# Patient Record
Sex: Female | Born: 1990 | Hispanic: No | Marital: Single | Smoking: Former smoker
Health system: Southern US, Community
[De-identification: ages and names within clinical notes are randomized; demographics above are authoritative.]

## PROBLEM LIST (undated history)

## (undated) ENCOUNTER — Ambulatory Visit: Admission: EM | Payer: Medicaid Other

## (undated) DIAGNOSIS — F419 Anxiety disorder, unspecified: Secondary | ICD-10-CM

## (undated) DIAGNOSIS — F32A Depression, unspecified: Secondary | ICD-10-CM

## (undated) DIAGNOSIS — S62308D Unspecified fracture of other metacarpal bone, subsequent encounter for fracture with routine healing: Secondary | ICD-10-CM

## (undated) HISTORY — DX: Depression, unspecified: F32.A

## (undated) HISTORY — DX: Anxiety disorder, unspecified: F41.9

## (undated) HISTORY — PX: CHOLECYSTECTOMY: SHX55

---

## 2013-09-12 ENCOUNTER — Inpatient Hospital Stay: Admit: 2013-09-12 | Discharge: 2013-09-12 | Disposition: A | Discharge status: Home / Self Care

## 2013-10-01 ENCOUNTER — Inpatient Hospital Stay
Admit: 2013-10-01 | Discharge: 2013-10-01 | Disposition: A | Discharge status: Home / Self Care | Attending: Emergency Medicine

## 2013-10-01 NOTE — ED Provider Notes (Signed)
PATIENTDEZARIA, METHOT        DOS:           10/01/2013  MR #:             [id number]             ACCOUNT #:     [id number]  DATE OF BIRTH:    11-24-1990              AGE:           23      PROBLEM LIST:     L Hand Pain: Entered Date: 01-Oct-2013 18:19, Entered By:  Carin Hock,  INTERFACES    HISTORY OF PRESENT ILLNESS:    PERTINENT HISTORY OF PRESENT  ILLNESS. The patient was evaluated in  collaboration with Dr. Benson Norway.  Chief complaint is left hand  pain  after punching a wall today.    PERTINENT PAST/ FAMILY/SOCIAL HISTORY Denies      PHYSICAL EXAM She is alert and oriented with stable vital signs.  She has  swelling at the fourth and fifth metacarpals of the left hand.  MSPs  intact.  There is no rotational deformity.  No wrist pain with range of  motion.    MEDICAL DECISION MAKING:    SIGNIFICANT FINDINGS/ED COURSE/MEDICAL DECISION MAKING/TREATMENT  PLAN Left  hand x-ray: Positive for a nondisplaced fifth metacarpal fracture.  And Ortho-Glass ulnar gutter splint placed.  She is prescribed #10 Norco for pain.  She is referred to orthopedic  clinic.  She voiced understanding of her instructions and all questions were  answered.      DIAGNOSIS Left fifth metacarpal fracture nondisplaced      ADDITIONAL INFORMATION If the physician assistant/nurse practitioner  was  involved in patient care, I personally performed and participated in  all the  above services (including HPI and PE). I have reviewed with the  physician  assistant/nurse practitioner the history and confirmed the findings  with the  patient. I personally performed all surgical procedures in the medical  record  unless otherwise indicated.    COPIES SENT TO::     NO, PCP DOCTOR(PCP): [id number]    Electronic Signatures:  Benson Norway (MD)  (Signed 01-Oct-2013 19:39)   Co-Signer: PROBLEM LIST, HISTORY OF PRESENT ILLNESS, PHYSICAL EXAM,  MEDICAL  DECISION MAKING, DIAGNOSIS, Additional Infomation, Copies to be sent  to:  Johnnette Litter (CNP)  (Signed 01-Oct-2013 19:36)   Authored: PROBLEM LIST, HISTORY OF PRESENT ILLNESS, PHYSICAL EXAM,  MEDICAL  DECISION MAKING, DIAGNOSIS, Additional Infomation, Copies to be sent  to:      Last Updated: 01-Oct-2013 19:39 by Benson Norway (MD)            Please see T-Sheet, initial assessment, and physician orders for  further details.    Dictating Physician: Johnnette Litter, CNP  Original Electronic Signature Date: 10/01/2013 06:29 P  [facility]  Document #: [id number]    cc:  PCP No       So[place]

## 2013-10-11 ENCOUNTER — Encounter

## 2013-10-14 ENCOUNTER — Encounter

## 2013-10-14 NOTE — Progress Notes (Signed)
Ms. ADRIANNA DUDAS returns today in follow-up of her recent left Small Finger Metacarpal  Fracture which occurred approximately 3 week ago. Options for treatment of her fracture, such as closed reduction or surgical stabilization were discussed & considered during prior visits and were Not indicated.  She has noted decreased discomfort and decreased swelling.    She notes no symptoms of numbness, tingling, no symptoms related to perfusion.    Physical Exam:  Skin (splint removed) and is Normal.   Digital range of motion is not assessed due to the presence of the un-healed fracture.  Wrist range of motion is somewhat stiff from immobilization.  Sensation is normal.  Vascular examination reveals normal and good capillary refill.  Swelling is minimal.  Clinical alignment and rotation are normal.      Radiographic Evaluation:  Radiographs were obtained today (3 views of the left hand) in the splint.  They demonstrate excellent maintenance of alignment of the fracture fragments, no rotational deformity, &  interval healing of the fracture.  The fracture does appear to be healed at this time.    Impression:  Ms. ARGENTINA KOSCH is doing well after recent left Small Finger Metacarpal Fracture.  It would appear that she has fully healed her fracture.    Plan:  Ms. Asuzena A Scaturro's splint is removed.    She is also instructed in  work on Active & Passive range of motion of the exposed digits & elbow.  These modalities were demonstrated to her today.  Her left 5th and 4th fingers were buddy taped together. We discussed the continued restrictions on the use of the injured hand & wrist and the limitations on resumption of activities.    We discussed the option of pursuing formalized hand therapy and a prescription  was not indicated.    I have asked Ms. GIRL SCHISSLER to follow-up with me in approximately 4 weeks from now for re-evaluation and repeat radiographs.    She is also specifically instructed to return to the office  or call for an appointment sooner if her symptoms are changing or worsening prior to that time.

## 2013-10-14 NOTE — Progress Notes (Deleted)
Subjective:      Patient ID: [name] is a 23 y.o. {race/ethnicity:17218} female.    Chief Complaint   Patient presents with   ??? Hand Injury     left 5th mc fx     {WUJ:81191}  {Also Blocks:18393::" "}     Social History     Occupational History   ??? Not on file.     Social History Main Topics   ??? Smoking status: Former Smoker   ??? Smokeless tobacco: Not on file   ??? Alcohol Use: Yes      Comment: occasionally   ??? Drug Use: No   ??? Sexual Activity: Not on file      ROS    Objective:   Ortho Exam      Assessment:     No diagnosis found.  ***    Plan:     ***

## 2013-12-21 ENCOUNTER — Encounter: Attending: Orthopaedic Surgery | Primary: Student in an Organized Health Care Education/Training Program

## 2013-12-30 LAB — C.TRACHOMATIS N.GONORRHOEAE DNA
C. Trachomatis Amplified: NEGATIVE
N. Gonorrhoeae Amplified: NEGATIVE

## 2014-03-16 ENCOUNTER — Inpatient Hospital Stay
Admit: 2014-03-16 | Discharge: 2014-03-17 | Disposition: A | Discharge status: Home / Self Care | Attending: Emergency Medicine

## 2014-03-16 NOTE — ED Provider Notes (Signed)
[name], [name]        DOS:           03/16/2014  MR #:             [id number]             ACCOUNT #:     [id number]  DATE OF BIRTH:    [birth year]              AGE:           23      HISTORY OF PRESENT ILLNESS:    PERTINENT HISTORY OF PRESENT  ILLNESS. Patient seen and evaluated  with  Lisbeth Ply. She presents the emergency department for complaint of nausea  for the  last 1 point 5 months. She states she has occasional abdominal pain.  She has no  pain currently. She denies vomiting but states she has a decreased  appetite. No  fevers or chills. She states she is having a problem with her  significant other  and feels anxious. No vaginal discharge or concern for that she  transmitted  infection.    PERTINENT PAST/ FAMILY/SOCIAL HISTORY Cholecystectomy        PHYSICAL EXAM Patient is alert and oriented x3 with normal vitals.  Head is  atraumatic. Pupils equal round reactive to light. Heart rate and  rhythm  regular. Lungs clear throughout. No respiratory distress. Abdomen is  soft and  nontender. Nondistended. Back is nontender. Extremities nontender  without  edema. Skin is normal in color with no rashes. Remainder of exam is  unremarkable.    MEDICAL DECISION MAKING:    SIGNIFICANT FINDINGS/ED COURSE/MEDICAL DECISION MAKING/TREATMENT  PLAN Patient  is well appearing in no acute distress. She has normal vital signs.  She is  afebrile. She complains of nausea for 1 point 5 months. She also  states she  feels anxious because she is having difficulties with her significant  other.  She states she has occasional mild abdominal discomfort. She is  currently  pain-free. Her abdomen is soft and nontender. No peritoneal signs.  Urinalysis  is negative for infection. HCG is negative. She she was Zofran ODT  here in the  emergency department. She'll be given a prescription for Phenergan.  She'll  followup with [facility] and return any worsening symptoms. She is  discharged in stable  condition.    PROBLEM LIST:       Admit Reason:     nausea - abd pain: Entered Date: 16-Mar-2014 17:52, Entered By:  Standley Brooking, Status: Active        DIAGNOSIS 1. nausea        ADDITIONAL INFORMATION If the physician assistant/nurse practitioner  was  involved in patient care, I personally performed and participated in  all the  above services (including HPI and PE). I have reviewed with the  physician  assistant/nurse practitioner the history and confirmed the findings  with the  patient. I personally performed all surgical procedures in the medical  record  unless otherwise indicated.      COPIES SENT TO::     NO, PCP DOCTOR(PCP): [id number]    Electronic Signatures:  Joellyn Haff (NP)  (Signed 16-Mar-2014 20:38)   Authored: HISTORY OF PRESENT ILLNESS, PHYSICAL EXAM, MEDICAL DECISION  MAKING,  PROBLEM LIST, DIAGNOSIS, Additional Infomation, Copies to be sent to:  Benson Norway (MD)  (Signed 16-Mar-2014 22:20)   Co-Signer: HISTORY  OF PRESENT ILLNESS, PHYSICAL EXAM, PROBLEM LIST,  Additional  Infomation, Copies to be sent to:      Last Updated: 16-Mar-2014 22:20 by Benson NorwayEJULIUS, DENNIS (MD)            Please see T-Sheet, initial assessment, and physician orders for  further details.    Dictating Physician: Joellyn HaffAmanda M Angelyn Osterberg, CNP  Original Electronic Signature Date: 03/16/2014 07:35 P  [facility]  Document #: [id number]    cc:  PCP No       Soarian

## 2014-03-17 LAB — URINALYSIS, MICRO: RBC, UA: NEGATIVE /HPF (ref 0–2)

## 2014-03-17 LAB — URINALYSIS-MACROSCOPIC
Bilirubin, Urine: 1
Nitrite, Urine: NEGATIVE
Occult Blood,Urine: NEGATIVE Ery/micL
Specific Gravity, Urine: 1.015 (ref 1.005–1.030)
Total Protein, Urine: NEGATIVE mg/dL
Volume: 12
pH, Urine: 7 (ref 5.0–8.0)

## 2014-03-17 LAB — PREGNANCY, URINE: HCG Urine: NEGATIVE

## 2014-03-30 ENCOUNTER — Encounter: Attending: Women's Health | Primary: Student in an Organized Health Care Education/Training Program

## 2014-04-26 NOTE — ED Provider Notes (Signed)
[name]:          [name], [name]        DOS:           04/26/2014  MR #:             [id number]             ACCOUNT #:     [id number]  DATE OF BIRTH:    Nov 05, 1990              AGE:           24      HISTORY OF PRESENT ILLNESS:    PERTINENT HISTORY OF PRESENT  ILLNESS. Patient was seen with Dr.  Gerrit HeckBlanda.  Patient presents complaining of burning abdominal pain.  She states  that the  last month she has had intermittent episodes of nausea and left upper  quadrant  pain.  These symptoms are worse when laying supine and accompanied by  Spence cough  at night and when she wakes in the morning.  Patient also notes that  they are  exacerbated by sutures fruits and "[employer]."  Patient states that  today this  burning pain became worse which prompted her emergency department  visit.  She  denies any fever, chills, chest pain, dyspnea, diaphoresis, or other  associated  symptoms.    PERTINENT PAST/ FAMILY/SOCIAL HISTORY Cholecystectomy        PHYSICAL EXAM Vitals: Patient is afebrile.  Vital signs  unremarkable.  General: Patient alert, sitting upright in bed. Non-toxic appearing,  in no  acute distress.  HEENT: Normocephalic, atraumatic. CN 2-12 grossly intact.  No scleral  icterus  noted.  Chest: Lungs clear to auscultation bilaterally.  No rales or wheezes  noted.  Heart regular rate and rhythm, no murmurs rubs or gallops.  Abdomen: Soft, mild epigastric tenderness, nondistended.  Bowel sounds  normal.  No guarding or rebound tenderness.  Extremities: No lower extremity tenderness or edema.  2+ peripheral  pulses in  all 4 extremities  Skin: Color normal, warm, dry    MEDICAL DECISION MAKING:    SIGNIFICANT FINDINGS/ED COURSE/MEDICAL DECISION MAKING/TREATMENT  PLAN Patient  presents complaining of burning epigastric pain exacerbated by citrus  foods,  worse when laying supine, and associated with Spence supine cough.  Physical exam  was remarkable for mild epigastric tenderness but was otherwise  unremarkable.  Given the  nature of the patient's symptoms and her physical exam, I am  not  concerned for Spence surgical abdomen at this time.  Patient has implanted  hormonal  birth control and uses condoms in declined pregnancy test while in  the  Emergency Department.  I believe her symptoms are consistent with  gastroesophageal reflux disease.  She was given Spence Programmer, applicationsGrasshopper which  improved  her symptoms.  She will be discharged with Spence prescription for Pepcid.  At this  point I feel she is safe to discharge home.  Diagnosis was discussed  with  patient.  She is amenable to discharge home with followup with her  PCP.  The  patient was instructed to return to the emergency department if her  symptoms  worsen or if she develops new problems.    PROBLEM LIST:       Admit Reason:     ABD Pain: Entered Date: 26-Apr-2014 21:29, Entered By: Standley BrookingINTERFACES,  INTERFACES, Status: Active        DIAGNOSIS 1.  Gastroesophageal reflux disease  ADDITIONAL INFORMATION The Emergency Medicine attending physician  was present  in the Emergency Department, who reviewed case management, and  approved  evaluation/treatment.      COPIES SENT TO::     NO, PCP DOCTOR(PCP): [id number]    Electronic Signatures:  Tommy Medal (MD)  (Signed 26-Apr-2014 23:43)   Co-Signer: HISTORY OF PRESENT ILLNESS, PHYSICAL EXAM, MEDICAL  DECISION MAKING,  PROBLEM LIST, DIAGNOSIS, Additional Infomation  Nakeya Adinolfi C (MD)  (Signed 26-Apr-2014 23:39)   Authored: HISTORY OF PRESENT ILLNESS, PHYSICAL EXAM, MEDICAL DECISION  MAKING,  PROBLEM LIST, DIAGNOSIS, Additional Infomation, Copies to be sent to:      Last Updated: 26-Apr-2014 23:43 by Tommy Medal (MD)            Please see T-Sheet, initial assessment, and physician orders for  further details.    Dictating Physician: Jacqulyn Cane, MD  Original Electronic Signature Date: 04/26/2014 11:06 P  [facility]  Document #: [id number]    cc:  PCP No       Soarian

## 2014-04-27 ENCOUNTER — Inpatient Hospital Stay
Admit: 2014-04-27 | Discharge: 2014-04-27 | Disposition: A | Discharge status: Home / Self Care | Attending: Emergency Medicine

## 2014-05-15 ENCOUNTER — Ambulatory Visit
Admit: 2014-05-15 | Discharge: 2014-05-15 | Payer: BLUE CROSS/BLUE SHIELD | Attending: Women's Health | Primary: Student in an Organized Health Care Education/Training Program

## 2014-05-15 DIAGNOSIS — R300 Dysuria: Secondary | ICD-10-CM

## 2014-05-15 LAB — POCT WET PREP
Clue Cells, Wet Prep: NEGATIVE
Hyphal Yeast: NEGATIVE
Trichomonas: NEGATIVE

## 2014-05-15 LAB — CBC
Hematocrit: 41 % (ref 35.0–47.0)
Hemoglobin: 13.4 g/dl (ref 11.7–16.0)
MCH: 26.7 pg (ref 26.0–34.0)
MCHC: 32.8 % (ref 32.0–36.0)
MCV: 81.6 fl (ref 79.0–98.0)
MPV: 8 fl (ref 7.4–10.4)
Platelets: 452 10*3/uL — ABNORMAL HIGH (ref 140–440)
RBC: 5.02 10*6/uL (ref 3.80–5.20)
RDW: 14.6 % — ABNORMAL HIGH (ref 11.5–14.5)
WBC: 7.9 10*3/uL (ref 3.6–10.7)

## 2014-05-15 LAB — TSH: TSH: 0.96 micUnt/mL (ref 0.36–3.74)

## 2014-05-15 MED ORDER — SULFAMETHOXAZOLE-TRIMETHOPRIM 800-160 MG PO TABS
800-160 MG | ORAL_TABLET | Freq: Two times a day (BID) | ORAL | Status: AC
Start: 2014-05-15 — End: 2014-05-18

## 2014-05-15 MED ORDER — PHENAZOPYRIDINE HCL 200 MG PO TABS
200 MG | ORAL_TABLET | Freq: Three times a day (TID) | ORAL | Status: AC | PRN
Start: 2014-05-15 — End: 2014-05-17

## 2014-05-15 NOTE — Progress Notes (Signed)
Subjective:      Patient ID: [name] is a 24 y.o. female.    HPI  Pt with c/o burning with urination and urinary frequency x5 days.  Denies pelvic pain.  Reports occasional discharge, but denies any currently.  Denies itching and odor.  Nexplanon for contraception.  Pt reports unexplained weight loss since last visit.  Pt has lost 20lbs since last visit without changing any of her daily habits.  Review of Systems   Constitutional: Positive for unexpected weight change.   Genitourinary: Positive for dysuria and difficulty urinating. Negative for vaginal bleeding, vaginal discharge, vaginal pain, menstrual problem and pelvic pain.       Objective:   Physical Exam   Constitutional: She is oriented to person, place, and time. She appears well-developed and well-nourished.   Pulmonary/Chest: Effort normal.   Genitourinary: No labial fusion. There is no rash, tenderness, lesion or injury on the right labia. There is no rash, tenderness, lesion or injury on the left labia. Cervix exhibits no discharge and no friability. No erythema, tenderness or bleeding in the vagina. No foreign body around the vagina. No signs of injury around the vagina. Vaginal discharge found.   Neurological: She is alert and oriented to person, place, and time.   Skin: Skin is warm and dry.   Psychiatric: She has a normal mood and affect.       Assessment:      1.) Dysuria  2.) Vaginal discharge  3.) Weight loss       Plan:      1.) Rx sulfa/tmp.  Urine culture sent.  2.) Wet prep negative.  GC/CT sent.  3.) Check TSH and CBC.  Instructed pt to f/u with PCP regarding weight loss.

## 2014-05-15 NOTE — Patient Instructions (Signed)
Painful Urination (Dysuria): Care Instructions  Your Care Instructions  Burning pain with urination (dysuria) is a common symptom of a urinary tract infection or other urinary problems. The bladder may become inflamed. This can cause pain when the bladder fills and empties. You may also feel pain if the tube that carries urine from the bladder to the outside of the body (urethra) gets irritated or infected.  Sexually transmitted infections (STIs) also may cause pain when you urinate.  Sometimes the pain can be caused by things other than an infection. The urethra can be irritated by soaps, perfumes, or foreign objects in the urethra. Kidney stones can cause pain when they pass through the urethra.  The cause may be hard to find. You may need tests. Treatment for painful urination depends on the cause.  Follow-up care is a key part of your treatment and safety. Be sure to make and go to all appointments, and call your doctor if you are having problems. It's also a good idea to know your test results and keep a list of the medicines you take.  How can you care for yourself at home?   Drink extra water and juices such as cranberry and blueberry juices for the next day or two. This will help make the urine less concentrated. And it may help wash out any bacteria that may be causing an infection. (If you have kidney, heart, or liver disease and have to limit fluids, talk with your doctor before you increase the amount of fluids you drink.)   Avoid drinks that are carbonated or have caffeine. They can irritate the bladder.   Urinate often. Try to empty your bladder each time.  For women:   Urinate right after you have sex.   After going to the bathroom, wipe from front to back.   Avoid douches, bubble baths, and feminine hygiene sprays. And avoid other feminine hygiene products that have deodorants.  When should you call for help?  Call your doctor now or seek immediate medical care if:   You have new symptoms,  such as fever, nausea, or vomiting.   You have new or worse symptoms of a urinary problem. For example:   You have blood or pus in your urine.   You have chills or body aches.   It hurts worse to urinate.   You have groin or belly pain.   You have pain in your back just below your rib cage (the flank area).  Watch closely for changes in your health, and be sure to contact your doctor if you have any problems.   Where can you learn more?   Go to [email or web address] and sign in to your MyChart account. Enter H814 in the Search Health Information box to learn more about "Painful Urination (Dysuria): Care Instructions."    If you do not have an account, please click on the "Sign Up Now" link.      2006-2015 [employer]. Care instructions adapted under license by [facility]. This care instruction is for use with your licensed healthcare professional. If you have questions about a medical condition or this instruction, always ask your healthcare professional. [employer] disclaims any warranty or liability for your use of this information.  Content Version: 10.6.465758; Current as of: November 02, 2012

## 2014-05-16 LAB — C.TRACHOMATIS N.GONORRHOEAE DNA
C. Trachomatis Amplified: POSITIVE — ABNORMAL HIGH
N. Gonorrhoeae Amplified: NEGATIVE

## 2014-05-16 MED ORDER — AZITHROMYCIN 500 MG PO TABS
500 MG | ORAL_TABLET | Freq: Once | ORAL | Status: AC
Start: 2014-05-16 — End: 2014-05-16

## 2014-05-16 NOTE — Telephone Encounter (Signed)
CAll to  Pt LM to call whc

## 2014-05-16 NOTE — Telephone Encounter (Signed)
Please let pt know she is +CT.  Rx azithromycin sent to pharmacy.  Pt will need to notify partner and should refrain from intercourse x7 days after finishing treatment.  Would like to see pt back in 3 months for repeat GC/CT testing.  Thanks.

## 2014-05-22 NOTE — Telephone Encounter (Signed)
Pt called and informed of the above and that a RX was sent to her pharmacy. Pt instructed to notify her partner and that he will also need treated and that they will need to abstain for at least 7 days after they both have finished tx. [facility] verbal[facility] understanding [facility]. Will pick Rx Up tonight

## 2014-06-19 ENCOUNTER — Ambulatory Visit
Admit: 2014-06-19 | Payer: BLUE CROSS/BLUE SHIELD | Attending: Hospitalist | Primary: Student in an Organized Health Care Education/Training Program

## 2014-06-19 DIAGNOSIS — R42 Dizziness and giddiness: Secondary | ICD-10-CM

## 2014-06-19 NOTE — Progress Notes (Signed)
INTERNAL MEDICINE  CLINIC     [name]  [birth year]  Visit Date: 06/19/14    Note completed by Dr. Tobey BrideSusanna Shirlean Berman (Internal Medicine Resident - PGY3).     Cosigned/Staffed with Dr. Ronnie DerbyStephanie Spence.    Reason for Appointment:       She  has no past medical history on file.    She presents today to be seen for dizziness and bilateral LE bruising.    Assessments & Plan      1. Dizziness  CBC    TSH without Reflex    Comprehensive Metabolic Panel  The patient's symptoms sound more like pre-syncope as opposed to vertigo. Some possible etiologies could be hypoglycemia, low BP (normal today in clinic), dehydration. Will check CBC and CMP today, but the work-up is somewhat limited because of the lack of time today. Will have her RTC in 1-2 weeks to continue work-up. Advised patient to call the clinic or go to the ED if symptoms worsen   2. Bruising  Will get CBC and CMP today to check for any abnormalities. Could be caused by a couple differentials, including platelet disorders, blood dyscrasias, liver disease, and others. Pt denies any bleeding. Will continue work-up at next appointment in 1-2 weeks       Health Maintenance Due   Topic Date Due   ??? TETANUS VACCINE ADULT (11 YEARS AND UP)  11/04/2002   ??? HIV SCREENING  11/04/2006       No problem-specific assessment & plan notes found for this encounter.      Medications, indications, directions, and side effects were discussed.    She voices understanding and acceptance of this advice and will call back if any further questions or concerns.    Follow-up:   No Follow-up on file.    Subjective:   HPI  HPI Comments: Of note, the patient indicated she only had 15 minutes before she had to leave from the start of the encounter. She indicates that starting on 14th, the patient has had episodes where she felt like she was going to faint. The first day it happened, she felt like she had a lot of pressure in her head and also reported feeling confused. 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 It lasted about 15 minutes and it has occurred every day since then, lasting the same amount of time, except the last 2 days. She can't associate it with any activity and nothing she does makes it better, it just passes on its own. She specifically denies any shortness of breath, diaphoresis or N/V with it. She is able to get up and move around without losing her balance and reports that it doesn't feel like the world is spinning. She has not started any new medications, has not had any recent illness and denies any dietary changes. Additionally, she reports that she has been  Noticing bruising on her B/L LE without trauma. She hasn't had any abnorma bleeding at all, specifically denying bleeding from her gums when brushing her teeth or nose bleeds. She reports that her menstrual cycle is very irregular, but has always been that way     Dizziness  This is a new problem. The current episode started 1 to 4 weeks ago (Started on 06/08/14). The problem occurs daily. The problem has been unchanged. Associated symptoms include arthralgias and myalgias. Pertinent negatives include no abdominal pain, anorexia, chest pain, chills, congestion, coughing, diaphoresis, fatigue,  fever, headaches, nausea, neck pain, numbness, urinary symptoms, vertigo, visual change, vomiting or weakness. Associated symptoms comments: C/o feeling a "pressure", as if her head is being squeezed. Denies psyncope, gait changes or diaphoresis. Reports feeling confused during episodes. Nothing aggravates the symptoms. She has tried nothing for the symptoms.       Review of Systems   Constitutional: Negative for fever, chills, diaphoresis and fatigue.   HENT: Negative for congestion.    Eyes: Negative for blurred vision.   Respiratory: Negative for cough.    Cardiovascular: Negative for chest pain.   Gastrointestinal: Negative for nausea, vomiting, abdominal pain and anorexia.    Musculoskeletal: Positive for myalgias and arthralgias. Negative for neck pain.   Neurological: Positive for dizziness. Negative for vertigo, weakness, numbness and headaches.   Endo/Heme/Allergies: Bruises/bleeds easily.       No Known Allergies  Current Outpatient Prescriptions on File Prior to Visit   Medication Sig Dispense Refill   ??? Acetaminophen (TYLENOL PO) Take by mouth every 6 hours     ??? ibuprofen (ADVIL;MOTRIN) 200 MG tablet Take 200 mg by mouth every 6 hours as needed for Pain       No current facility-administered medications on file prior to visit.        Objective:     Filed Vitals:    06/19/14 1556   BP: 105/62   Pulse: 76   Temp: 97.8 ??F (36.6 ??C)   Height:  (1.6 m)   Weight: 138 lb (62.596 kg)     Body mass index is 24.45 kg/(m^2).    Physical Exam   Constitutional: She is oriented to person, place, and time and well-developed, well-nourished, and in no distress. No distress.   HENT:   Head: Normocephalic and atraumatic.   Mouth/Throat: Oropharynx is clear and moist. No oropharyngeal exudate.   Eyes: Conjunctivae and EOM are normal. Pupils are equal, round, and reactive to light. Right eye exhibits no discharge. Left eye exhibits no discharge. No scleral icterus.   Neck: Normal range of motion. Neck supple. No tracheal deviation present. No thyromegaly present.   Cardiovascular: Normal rate, regular rhythm and normal heart sounds.  Exam reveals no gallop and no friction rub.    No murmur heard.  Pulmonary/Chest: Effort normal and breath sounds normal. No stridor. No respiratory distress. She has no wheezes. She has no rales. She exhibits no tenderness.   Musculoskeletal: Normal range of motion. She exhibits tenderness. She exhibits no edema.   Lymphadenopathy:     She has no cervical adenopathy.   Neurological: She is alert and oriented to person, place, and time. No cranial nerve deficit. Gait normal. Coordination normal.   Normal cerebellar function   Skin: She is not diaphoretic.    Ecchymosis present on B/L LE at different stages of healing       Electronically signed by Tobey Bride, MD on 06/19/14 at 4:26 PM

## 2014-06-20 DIAGNOSIS — R42 Dizziness and giddiness: Secondary | ICD-10-CM

## 2014-06-21 NOTE — Progress Notes (Signed)
Patient was staffed with me, plan of care was reviewed and note co-signed.

## 2014-08-22 ENCOUNTER — Encounter: Attending: Women's Health | Primary: Student in an Organized Health Care Education/Training Program

## 2014-08-25 ENCOUNTER — Encounter: Attending: Hospitalist | Primary: Student in an Organized Health Care Education/Training Program

## 2014-09-30 LAB — POCT GLUCOSE: POC Glucose: 72 mg/dL (ref 70–100)

## 2014-09-30 NOTE — ED Provider Notes (Signed)
[name], [name]        DOS:           09/30/2014  MR #:             [id number]             ACCOUNT #:     [id number]  DATE OF BIRTH:    11/12/1990              AGE:           24      HISTORY OF PRESENT ILLNESS:    PERTINENT HISTORY OF PRESENT  ILLNESS. 1 day of intermittent  headache.  Also  occasional feeling lightheaded with standing.  Had some diffuse  intermittent  tingling and photophobia.  Usually has anxiety with palpitations and  heart  racing.  She is has no history of migraine but has had headaches many  times  previously without a workup similar to this.    PERTINENT PAST/ FAMILY/SOCIAL HISTORY Cholecystectomy, chronic  headaches        PHYSICAL EXAM Vitals stable.  Patient is alert no acute distress.  Cranial  nerves II through XII intact.  Normal strength and sensation.  Normal  cerebellar exam.  Lightheaded with standing so was not ambulated.    MEDICAL DECISION MAKING:    SIGNIFICANT FINDINGS/ED COURSE/MEDICAL DECISION MAKING/TREATMENT  PLAN  Orthostatic negative.  After Compazine and Benadryl her symptoms had  resolved.  Able to stand without symptoms.  She is comfortable with being  discharged.    PROBLEM LIST:         ED Diagnosis:     Migraine with aura and without status migrainosus, not intractable  (G43.109): Entered Date: 30-Sep-2014 15:40, Entered By: Margo Common,  Status: Active, ICD-10: G43.109      COPIES SENT TO::     NO, PCP DOCTOR(PCP): [id number]    Electronic Signatures:  Margo Common (MD)  (Signed 30-Sep-2014 15:40)   Authored: HISTORY OF PRESENT ILLNESS, PHYSICAL EXAM, MEDICAL DECISION  MAKING,  PROBLEM LIST, Copies to be sent to:      Last Updated: 30-Sep-2014 15:40 by Margo Common (MD)            Please see T-Sheet, initial assessment, and physician orders for  further details.    Dictating Physician: Margo Common, MD  Original Electronic Signature Date: 09/30/2014 02:54 P  Document #: [id number]    cc:  PCP No        Soarian

## 2014-11-27 ENCOUNTER — Encounter
Attending: Student in an Organized Health Care Education/Training Program | Primary: Student in an Organized Health Care Education/Training Program

## 2014-11-29 ENCOUNTER — Encounter
Attending: Student in an Organized Health Care Education/Training Program | Primary: Student in an Organized Health Care Education/Training Program

## 2015-01-08 LAB — BASIC METABOLIC PANEL
Anion Gap: 8 NA
BUN: 7 mg/dL (ref 7–25)
CO2: 28 mmol/L (ref 21–32)
Calcium: 8.7 mg/dL (ref 8.2–10.1)
Chloride: 106 mmol/L (ref 98–109)
Creatinine: 0.62 mg/dL (ref 0.55–1.40)
EGFR IF NonAfrican American: >60 mL/min (ref >60)
Glucose: 84 mg/dL (ref 70–100)
Potassium: 3.8 mmol/L (ref 3.5–5.1)
Sodium: 142 mmol/L (ref 135–145)
eGFR African American: >60 mL/min (ref >60)

## 2015-01-08 LAB — CBC
Hematocrit: 37.3 % (ref 35.0–47.0)
Hemoglobin: 12.2 g/dL (ref 11.7–16.0)
MCH: 27.2 pg (ref 26.0–34.0)
MCHC: 32.8 % (ref 32.0–36.0)
MCV: 82.9 fL (ref 79.0–98.0)
MPV: 7.8 fL (ref 7.4–10.4)
Platelets: 322 10*3/uL (ref 140–440)
RBC: 4.5 10*6/uL (ref 3.80–5.20)
RDW: 14.3 % (ref 11.5–14.5)
WBC: 10.5 10*3/uL (ref 3.6–10.7)

## 2015-01-08 LAB — C-REACTIVE PROTEIN: CRP: <2.9 mg/L (ref 0.0–2.9)

## 2015-01-08 LAB — SEDIMENTATION RATE: Sed Rate: 8 mm/h (ref 0–20)

## 2015-01-08 NOTE — ED Provider Notes (Signed)
PATIENTTIMEA, Spence        DOS:           01/08/2015  MR #:             [id number]             ACCOUNT #:     [id number]  DATE OF BIRTH:    01-Sep-1990              AGE:           24      HISTORY OF PRESENT ILLNESS:    PERTINENT HISTORY OF PRESENT  ILLNESS. [name] seen with Dr. Genene Churn.  [name] presents complaining of right arm pain that began  approximately 10  hours ago.  She was sitting in her car when she had sudden onset of  burning  pain that has gotten worse over the last several hours.  She says it  hurts when  she moves.  She states that several days ago she did have a bone  marrow that  came across her arm but was not having similar pain after that.  She  states she  then noticed to small bumps appear on the medial aspect of the arm.  They are  not warm to touch.  They are tender palpation.  There is been no  drainage.  She  denies fever chills.  Denies chest pain or shortness of breath.  Denies nausea  or vomiting.  He denies numbness or tingling.    PERTINENT PAST/ FAMILY/SOCIAL HISTORY Cholecystectomy        PHYSICAL EXAM Vitals: [name] is afebrile.  Vital signs  unremarkable.  General: [name] alert, NAD, WD/WN  HEENT: Normocephalic, atraumatic. EOMI, moist mucous membranes, neck  is supple  Cardiac: regular rate and rhythm, no murmurs  Respiratory: Lungs clear to auscultation bilaterally.  No rales or  wheezes  noted.  Abdomen: Soft, nontender, nondistended.  No masses noted. No guarding  or  rebound tenderness.  Extremities: Tender palpation over medial aspect of right elbow with  overlying  patchy macular rash and edema, no short arc range of motion but pain  with  passive and active range of motion of the elbow, limiting strength  exam,  sensation intact to light touch distally    MEDICAL DECISION MAKING:    SIGNIFICANT FINDINGS/ED COURSE/MEDICAL DECISION MAKING/TREATMENT  PLAN Given  the [name]'s significant pain as well as slight swelling, we did  obtain an  x-ray which showed  minimal soft tissue swelling but no air or bony or  joint  abnormality.  Complete blood count BMP ESR and CRP were obtained were  unremarkable.  There is no evidence of infectious etiology to this  pain.  There  is no bony abnormality or muscular skeletal abnormality.  It is  possible that  it is inflammation from the trauma several days ago.  She was given  morphine  and Zofran for pain and does have improvement with this.  She is  instructed to  take Naprosyn at home for pain.  She is given signs and symptoms for  which to  return.  [name] is discharged in stable condition.    PROBLEM LIST:       ED Diagnosis:     Pain and swelling of right elbow (M25.521): Entered Date:  08-Jan-2015 04:49,  Entered By: Lytle Butte, Status: Active, ICD-10: O71.219  ADDITIONAL INFORMATION The Emergency Medicine attending physician  was present  in the Emergency Department, who reviewed case management, and  approved  evaluation/treatment.      COPIES SENT TO::     NO, PCP DOCTOR(PCP): [id number]  Electronic Signatures:  AHMED, RAMI (DO)  (Signed 14-Jan-2015 17:59)   Co-Signer: HISTORY OF PRESENT ILLNESS, PHYSICAL EXAM, MEDICAL  DECISION MAKING,  PROBLEM LIST, Additional Infomation, Copies to be sent to:  Lytle Butte (MD)  (Signed [phone] 04:49)   Authored: HISTORY OF PRESENT ILLNESS, PHYSICAL EXAM, MEDICAL DECISION  MAKING,  PROBLEM LIST, Additional Infomation, Copies to be sent to:      Last Updated: 14-Jan-2015 17:59 by AHMED, RAMI (DO)            Please see T-Sheet, initial assessment, and physician orders for  further details.    Dictating Physician: Viann Fish, MD  Original Electronic Signature Date: 01/08/2015 04:49 A  JF  Document #: [id number]    cc:  PCP No       Soarian

## 2015-01-16 ENCOUNTER — Ambulatory Visit
Admit: 2015-01-16 | Discharge: 2015-01-16 | Payer: PRIVATE HEALTH INSURANCE | Attending: Hospitalist | Primary: Student in an Organized Health Care Education/Training Program

## 2015-01-16 DIAGNOSIS — M79631 Pain in right forearm: Secondary | ICD-10-CM

## 2015-01-16 MED ORDER — DOXYCYCLINE HYCLATE 100 MG PO TABS
100 MG | ORAL_TABLET | Freq: Two times a day (BID) | ORAL | 0 refills | Status: DC
Start: 2015-01-16 — End: 2015-01-16

## 2015-01-16 MED ORDER — DOXYCYCLINE HYCLATE 100 MG PO TABS
100 MG | ORAL_TABLET | Freq: Two times a day (BID) | ORAL | 0 refills | Status: AC
Start: 2015-01-16 — End: 2015-01-21

## 2015-01-16 NOTE — Progress Notes (Signed)
Attending Physician Statement  I have discussed the care of [name] including pertinent history and exam findings,  with the resident. I have seen and examined the patient and the key elements of all parts of the encounter have been performed by me.  I agree with the assessment, plan and orders as documented by the resident.  (GC Modifier)    There is discrete area of redness medial aspect of right arm.  NO streaking erythema, joint pains or involvement of elbow, NO eschar or puncture marks.  Area is tender but NO fluctuance.  No area of bruising on the forarm.  Clinically patient does NOT appear to be in any distress upon my exam nor appear toxic.Will treat with doxycycline and warm compress.      Patient with multiple complaints that was difficult to pin-point a pertinent review of symptoms.      She asked resident to provide her a letter to welfare that she cannot work because she is "stressed from loosing her previous job."  We cannot provide this letter for her.      Will obtain STD screening since she was positive for Trichomonas in 05/10/14.  She requested HIV testing.

## 2015-01-16 NOTE — Progress Notes (Signed)
Subjective:  [name] is a 24 y.o. female with chief complaint of Follow-Up from Hospital (e.r ); Joint Swelling (rt side ); Chest Pain (sharp when she breaths under left breast ); Irritable Bowel Syndrome; Other (letter for work that gives her extra bathroom breaks); and Dizziness    Labs and imaging reviewed with patient during this office visit from 01/08/15.    HPI:  HPI  Patient is coming in mainly to check on a right arm swelling. Patient explians that she was in the car on 01/08/15 and her arm started to hurt, which came and went. Then she explains that 3 bumps came up. She went to the ED got it looked at and X-rayed with no evidence of fracture and was discharged home with naproxen. She comes in today explianing that one bump remains that is still red and swelling has increased somewhat. About 8 days before she was plalying with her bow-arrow and hit her self on that arm, but no brusing or cuts were made.    She feels that she has increased stress. She was recently fired from her job on October 10th, 2016 and would like to touch base with Behavioral health.     She explains she has IBS type symptoms ever since she got her gallbladder removed in December 2013.    Patient has chest pain right under her left breast. It lasted longer than usual on Saturday. It comes and goes. She noticed the beginnings of this a couple of weeks ago.    Patient explains that she has had throat pain over the weekend and explains she had some nausea as well as night sweats over the weekend, but that these have subsided.    ROS:  ROS  No recent issues with nausea, vomiting, consitipation, fevers, chills.  Patient explains that she had a headache over the weekend, but nothing currently.  No current issues with dizziness.  Patient explains she has some diarrhrea with soft formed stools and goes about 3 times a day.          Current Outpatient Prescriptions on File Prior to Visit   Medication Sig Dispense Refill   ???  Acetaminophen (TYLENOL PO) Take by mouth every 6 hours     ??? ibuprofen (ADVIL;MOTRIN) 200 MG tablet Take 200 mg by mouth every 6 hours as needed for Pain       No current facility-administered medications on file prior to visit.        No past medical history on file.  Family History   Problem Relation Age of Onset   ??? Other Mother      Social History     Social History   ??? Marital status: Married     Spouse name: N/A   ??? Number of children: N/A   ??? Years of education: N/A     Occupational History   ??? Not on file.     Social History Main Topics   ??? Smoking status: Former Smoker   ??? Smokeless tobacco: Not on file   ??? Alcohol use Yes      Comment: occasionally, 1-2 shots/week   ??? Drug use: No   ??? Sexual activity: Not on file     Other Topics Concern   ??? Not on file     Social History Narrative     Past Surgical History   Procedure Laterality Date   ??? Cholecystectomy  2013     Objective:  Vitals:  01/16/15 0903 01/16/15 0905 01/16/15 0906 01/16/15 0907   BP: 117/74 114/74 116/74 117/66   Site: Left Arm Left Arm Left Arm Left Arm   Position: Sitting Supine Sitting Sitting   Cuff Size: Medium Adult Medium Adult Medium Adult Medium Adult   Pulse: 81 76 82 97   Temp: 97.2 ??F (36.2 ??C)      TempSrc: Oral      Weight: 121 lb (54.9 kg)      Height: 5' 3"  (1.6 m)        Physical Exam  General: NAD, Alert, Non-diaphoretic.  HEENT: NCAT, EOMI, PERRL, MMM, No phyrangeal erythema.  Cardiac: RRR, S1+S2 heard, No murmurs, No gallops, No erythema or edema under left breast. No pain on palpation under left breast.  Pulm: CTAB, No wheezes, No crackles, Patient O2 saturation at 99% in office.  Abd: Soft, Nontender, Nondistended +BS.  Ext: Radial and Dorsalis Pedis pulses intact. No edema lower extremities. Right forearm has small swelling proximal to the medial epicondyle. No drainage or streaking is observed. No puncture wound is observed and no follicular involvement was observed. Location of focal edema is erythematous and warm to  touch. No epitrochlear lymphadenopathy.        Assessment:  1. Pain and swelling of forearm, right    2. Intercostal pain    3. Dizziness    4. STD (female)        Plan:  1. Pain and swelling of forearm, right  - Small swelling located proximally from the medial epicondyle of the right elbow. Initially started as 3 bumps in the same location, but has become one bump. This one has not fully resolved with the medications she obtained from her ED visit. This may be secondary to a bug bite. Will prescribe an antibiotic for 5 days and will advise warm compresses.   - doxycycline hyclate (VIBRA-TABS) 100 MG tablet; Take 1 tablet by mouth 2 times daily for 5 days  Dispense: 10 tablet; Refill: 0    2. Intercostal pain  - Started under left breast about a week ago. Comes and goes with breathing deeply or coughing/laughing. Vitals are within normal limits. Will obtain chest x-ray to rule out any infections vs. PE. This symptom may be related to the recent increase in stress in her life. A referral to [facility] was put in. Patient also met with behavioral health services, who will touch base with her in the next days to weeks. At this time would not like to start any medication since the increased stress and anxiety seems to be situational, but would like to have her establish with behavioral health services.   - XR Chest Standard TWO VW;  - [facility] ([address])    3. Dizziness  - Blood pressure while supine, sitting, and standing are within normal limits. No issues with dizziness during today's visit. It was explained to her that sometimes this may occur with dehydration. She was advised to drink plenty of water. Patient has not passed out as a result of the dizziness.    4. STD (female)  Patient has previous history of C. Trachomatis on 05/16/14. Will screen for other possible STDs such as HIV and Hepatitis B and C.  - Hepatitis B Surface Antibody  - Hepatitis B Surface Antigen  -  Hepatitis C Antibody  - HIV Screen      Orders Placed This Encounter   Medications   ??? doxycycline hyclate (VIBRA-TABS) 100  MG tablet     Sig: Take 1 tablet by mouth 2 times daily for 10 days     Dispense:  10 tablet     Refill:  0       Patient Active Problem List   Diagnosis   ??? Closed fracture of 5th metacarpal   ??? Dizziness   ??? Bruising       Medication indications, directions, and side effects were discussed.     Return in about 3 months (around 04/18/2015).    Great than 51% of the encounter was spent in direct patient interaction, history taking, physical examination, counseling, and education.    Electronically signed by Varney Baas on 01/16/15 at 9:10 AM

## 2015-01-16 NOTE — Progress Notes (Signed)
[place] CONSULT NOTE:    Met with patient per provider request. Pt experiencing depression/anxiety related to life stressors. Pt has recently gone through a divorce and has 2 children; 1 with special needs. Pt was recently laid off from her job and is losing her car today. Pt and her children currently reside with pt's mother. Pt reports her mother is going through depression/anxiety related to finances/life stressors and she and her mother often argue. Pt denies any SI/HI but does have difficulties feeling motivated with increase in irritability. Pt also has severe anxiety w/ mild panic attacks that have been causing chest pains for pt.     Review of Systems   Psychiatric/Behavioral: Positive for depression. Negative for suicidal ideas. The patient is nervous/anxious and has insomnia.    Reports impairment in sleep. Pt does not sleep d/t stress/anxiety and racing thoughts. Reports changes in appetite. Appetite fluctuates.     Physical Exam   Psychiatric: Memory and judgment normal. Her mood appears anxious. She exhibits a depressed mood. She expresses no homicidal and no suicidal ideation. She expresses no suicidal plans and no homicidal plans.    Patient appears appropriately dressed.  Patient's speech pattern is: Within normal limits.  Patient appears attentive and tearful.  Eye contact: good.  Affect: Anxious and Depressed  Thought process: linear.    INTERVENTION:  [facility] actively listened and provided support. Educated pt on correlation of physical and mental health and importance of caring for both. Pt often internalizes her emotions/feelings and reports little support. Discussed importance of talking through emotions/feelings rather than internalize as this could increase sxs. Pt not interested in counseling as she feels she does not have time for it. Pt is interested in starting medication and discussed this with Dr. Laurence Slate who will suggest ICV. Provided pt with [employer] to offer assistance to pt  and her family's basic needs. [facility] number  provided to pt and encouraged to call with questions/concerns. [place] to f/u with pt at later date.

## 2015-01-16 NOTE — Telephone Encounter (Signed)
Attempted to contact pt to schedule ICV. [facility] not answer [facility]

## 2015-01-16 NOTE — Progress Notes (Signed)
Patient refuses Influenza vaccine. Benefits and risks were discussed and understood.

## 2015-01-17 LAB — HEPATITIS B SURFACE ANTIGEN: Hepatitis B Surface Ag: NOT DETECTED NA

## 2015-01-17 LAB — HEPATITIS C ANTIBODY: Hepatitis C Ab: NOT DETECTED NA

## 2015-01-17 LAB — HIV SCREEN: HIV 1+2 AB+HIV1P24 AG, EIA: NONREACTIVE NA

## 2015-01-17 LAB — HEPATITIS B SURFACE ANTIBODY: Hep B S Ab: DETECTED NA — AB

## 2015-01-30 NOTE — Telephone Encounter (Signed)
[facility]. Pt reports no changes have been made in her mood or current stressors. Pt continues to feel depressed/anxious and having difficulties feeling motivated. Pt has not obtained new employment and no longer has a car to get around. Pt and her children currently living with her mother who is also stressed about finances. Pt has not engaged in counseling either but does talk to her boyfriend who has been emotionally supportive and helping financially when he can. Discussed starting antidepressants and pt agreeable to this. Discussed integrated care visits and pt requesting appt. ICV scheduled with Angelique Blonderenise, NP, for Friday 12/9 at 3pm. Pt was encouraged to call with questions/concerns. [facility] to f/u with pt at ICV.

## 2015-02-02 ENCOUNTER — Encounter: Attending: Nurse Practitioner | Primary: Student in an Organized Health Care Education/Training Program

## 2015-04-24 ENCOUNTER — Encounter: Attending: Hospitalist | Primary: Student in an Organized Health Care Education/Training Program

## 2019-08-25 DIAGNOSIS — Z419 Encounter for procedure for purposes other than remedying health state, unspecified: Secondary | ICD-10-CM | POA: Diagnosis not present

## 2019-09-09 DIAGNOSIS — O4403 Placenta previa specified as without hemorrhage, third trimester: Secondary | ICD-10-CM | POA: Diagnosis not present

## 2019-09-14 DIAGNOSIS — O365930 Maternal care for other known or suspected poor fetal growth, third trimester, not applicable or unspecified: Secondary | ICD-10-CM | POA: Diagnosis not present

## 2019-09-14 DIAGNOSIS — O365990 Maternal care for other known or suspected poor fetal growth, unspecified trimester, not applicable or unspecified: Secondary | ICD-10-CM | POA: Diagnosis not present

## 2019-09-14 DIAGNOSIS — Z3A31 31 weeks gestation of pregnancy: Secondary | ICD-10-CM | POA: Diagnosis not present

## 2019-09-25 DIAGNOSIS — Z419 Encounter for procedure for purposes other than remedying health state, unspecified: Secondary | ICD-10-CM | POA: Diagnosis not present

## 2019-09-27 DIAGNOSIS — O365990 Maternal care for other known or suspected poor fetal growth, unspecified trimester, not applicable or unspecified: Secondary | ICD-10-CM | POA: Diagnosis not present

## 2019-09-27 DIAGNOSIS — Z3A33 33 weeks gestation of pregnancy: Secondary | ICD-10-CM | POA: Diagnosis not present

## 2019-10-05 DIAGNOSIS — Z3A34 34 weeks gestation of pregnancy: Secondary | ICD-10-CM | POA: Diagnosis not present

## 2019-10-05 DIAGNOSIS — Z23 Encounter for immunization: Secondary | ICD-10-CM | POA: Diagnosis not present

## 2019-10-05 DIAGNOSIS — N390 Urinary tract infection, site not specified: Secondary | ICD-10-CM | POA: Diagnosis not present

## 2019-10-05 DIAGNOSIS — A609 Anogenital herpesviral infection, unspecified: Secondary | ICD-10-CM | POA: Diagnosis not present

## 2019-10-05 DIAGNOSIS — O4402 Placenta previa specified as without hemorrhage, second trimester: Secondary | ICD-10-CM | POA: Diagnosis not present

## 2019-10-05 DIAGNOSIS — Z590 Homelessness: Secondary | ICD-10-CM | POA: Diagnosis not present

## 2019-10-05 DIAGNOSIS — B009 Herpesviral infection, unspecified: Secondary | ICD-10-CM | POA: Diagnosis not present

## 2019-10-17 DIAGNOSIS — N898 Other specified noninflammatory disorders of vagina: Secondary | ICD-10-CM | POA: Diagnosis not present

## 2019-10-17 DIAGNOSIS — O26893 Other specified pregnancy related conditions, third trimester: Secondary | ICD-10-CM | POA: Diagnosis not present

## 2019-10-17 DIAGNOSIS — Z590 Homelessness: Secondary | ICD-10-CM | POA: Diagnosis not present

## 2019-10-18 DIAGNOSIS — O365990 Maternal care for other known or suspected poor fetal growth, unspecified trimester, not applicable or unspecified: Secondary | ICD-10-CM | POA: Diagnosis not present

## 2019-10-18 DIAGNOSIS — Z3A36 36 weeks gestation of pregnancy: Secondary | ICD-10-CM | POA: Diagnosis not present

## 2019-10-26 DIAGNOSIS — Z419 Encounter for procedure for purposes other than remedying health state, unspecified: Secondary | ICD-10-CM | POA: Diagnosis not present

## 2019-10-28 DIAGNOSIS — Z3483 Encounter for supervision of other normal pregnancy, third trimester: Secondary | ICD-10-CM | POA: Diagnosis not present

## 2019-11-07 DIAGNOSIS — Z2821 Immunization not carried out because of patient refusal: Secondary | ICD-10-CM | POA: Diagnosis not present

## 2019-11-07 DIAGNOSIS — O9852 Other viral diseases complicating childbirth: Secondary | ICD-10-CM | POA: Diagnosis not present

## 2019-11-07 DIAGNOSIS — S8012XA Contusion of left lower leg, initial encounter: Secondary | ICD-10-CM | POA: Diagnosis not present

## 2019-11-07 DIAGNOSIS — S8002XA Contusion of left knee, initial encounter: Secondary | ICD-10-CM | POA: Diagnosis not present

## 2019-11-07 DIAGNOSIS — M25562 Pain in left knee: Secondary | ICD-10-CM | POA: Diagnosis not present

## 2019-11-07 DIAGNOSIS — F17211 Nicotine dependence, cigarettes, in remission: Secondary | ICD-10-CM | POA: Diagnosis not present

## 2019-11-07 DIAGNOSIS — Z79899 Other long term (current) drug therapy: Secondary | ICD-10-CM | POA: Diagnosis not present

## 2019-11-07 DIAGNOSIS — B009 Herpesviral infection, unspecified: Secondary | ICD-10-CM | POA: Diagnosis not present

## 2019-11-07 DIAGNOSIS — O99334 Smoking (tobacco) complicating childbirth: Secondary | ICD-10-CM | POA: Diagnosis not present

## 2019-11-07 DIAGNOSIS — Z3A39 39 weeks gestation of pregnancy: Secondary | ICD-10-CM | POA: Diagnosis not present

## 2019-11-09 DIAGNOSIS — O418X30 Other specified disorders of amniotic fluid and membranes, third trimester, not applicable or unspecified: Secondary | ICD-10-CM | POA: Diagnosis not present

## 2019-11-09 DIAGNOSIS — O411230 Chorioamnionitis, third trimester, not applicable or unspecified: Secondary | ICD-10-CM | POA: Diagnosis not present

## 2019-11-09 DIAGNOSIS — O43813 Placental infarction, third trimester: Secondary | ICD-10-CM | POA: Diagnosis not present

## 2019-11-09 DIAGNOSIS — Z3A00 Weeks of gestation of pregnancy not specified: Secondary | ICD-10-CM | POA: Diagnosis not present

## 2019-11-25 DIAGNOSIS — Z419 Encounter for procedure for purposes other than remedying health state, unspecified: Secondary | ICD-10-CM | POA: Diagnosis not present

## 2019-12-26 DIAGNOSIS — Z419 Encounter for procedure for purposes other than remedying health state, unspecified: Secondary | ICD-10-CM | POA: Diagnosis not present

## 2020-01-25 DIAGNOSIS — Z419 Encounter for procedure for purposes other than remedying health state, unspecified: Secondary | ICD-10-CM | POA: Diagnosis not present

## 2020-02-25 DIAGNOSIS — Z419 Encounter for procedure for purposes other than remedying health state, unspecified: Secondary | ICD-10-CM | POA: Diagnosis not present

## 2020-03-27 DIAGNOSIS — Z419 Encounter for procedure for purposes other than remedying health state, unspecified: Secondary | ICD-10-CM | POA: Diagnosis not present

## 2020-04-24 DIAGNOSIS — Z419 Encounter for procedure for purposes other than remedying health state, unspecified: Secondary | ICD-10-CM | POA: Diagnosis not present

## 2020-05-25 DIAGNOSIS — Z419 Encounter for procedure for purposes other than remedying health state, unspecified: Secondary | ICD-10-CM | POA: Diagnosis not present

## 2020-06-24 DIAGNOSIS — Z419 Encounter for procedure for purposes other than remedying health state, unspecified: Secondary | ICD-10-CM | POA: Diagnosis not present

## 2020-07-23 ENCOUNTER — Emergency Department (HOSPITAL_COMMUNITY)
Admission: EM | Admit: 2020-07-23 | Discharge: 2020-07-23 | Disposition: A | Discharge status: Home / Self Care | Payer: Medicaid Other | Attending: Emergency Medicine | Admitting: Emergency Medicine

## 2020-07-23 ENCOUNTER — Encounter (HOSPITAL_COMMUNITY): Payer: Self-pay | Admitting: Emergency Medicine

## 2020-07-23 ENCOUNTER — Emergency Department (HOSPITAL_COMMUNITY): Payer: Medicaid Other

## 2020-07-23 DIAGNOSIS — M79605 Pain in left leg: Secondary | ICD-10-CM | POA: Diagnosis not present

## 2020-07-23 DIAGNOSIS — M25562 Pain in left knee: Secondary | ICD-10-CM | POA: Diagnosis not present

## 2020-07-23 LAB — CBC WITH DIFFERENTIAL/PLATELET
Abs Immature Granulocytes: 0 10*3/uL (ref 0.00–0.07)
Basophils Absolute: 0 10*3/uL (ref 0.0–0.1)
Basophils Relative: 0 %
Eosinophils Absolute: 0 10*3/uL (ref 0.0–0.5)
Eosinophils Relative: 0 %
HCT: 42.6 % (ref 36.0–46.0)
Hemoglobin: 14 g/dL (ref 12.0–15.0)
Immature Granulocytes: 0 %
Lymphocytes Relative: 44 %
Lymphs Abs: 1.1 10*3/uL (ref 0.7–4.0)
MCH: 28.3 pg (ref 26.0–34.0)
MCHC: 32.9 g/dL (ref 30.0–36.0)
MCV: 86.2 fL (ref 80.0–100.0)
Monocytes Absolute: 0.5 10*3/uL (ref 0.1–1.0)
Monocytes Relative: 21 %
Neutro Abs: 0.9 10*3/uL — ABNORMAL LOW (ref 1.7–7.7)
Neutrophils Relative %: 35 %
Platelets: 195 10*3/uL (ref 150–400)
RBC: 4.94 MIL/uL (ref 3.87–5.11)
RDW: 14.1 % (ref 11.5–15.5)
WBC: 2.5 10*3/uL — ABNORMAL LOW (ref 4.0–10.5)
nRBC: 0 % (ref 0.0–0.2)

## 2020-07-23 LAB — BASIC METABOLIC PANEL
Anion gap: 8 (ref 5–15)
BUN: 9 mg/dL (ref 6–20)
CO2: 29 mmol/L (ref 22–32)
Calcium: 9.5 mg/dL (ref 8.9–10.3)
Chloride: 107 mmol/L (ref 98–111)
Creatinine, Ser: 0.63 mg/dL (ref 0.44–1.00)
GFR, Estimated: >60 mL/min (ref >60)
Glucose, Bld: 119 mg/dL — ABNORMAL HIGH (ref 70–99)
Potassium: 3.6 mmol/L (ref 3.5–5.1)
Sodium: 144 mmol/L (ref 135–145)

## 2020-07-23 LAB — D-DIMER, QUANTITATIVE: D-Dimer, Quant: 0.4 ug/mL-FEU (ref 0.00–0.50)

## 2020-07-23 NOTE — ED Provider Notes (Signed)
[facility]-EMERGENCY DEPT Provider Note   CSN: [id number] Arrival date & time: 07/23/20  1919     History Chief Complaint  Patient presents with  . Leg Pain    Amy Jefferson is a 30 y.o. female.  HPI   30 year old otherwise healthy female presents emergency department with left leg pain.  She states over the past day she has developed pain behind the left knee.  She feels as if the left leg is swollen but has not noted any discoloration.  No fever, no injury to the leg.  No history of DVT.  History reviewed. No pertinent past medical history.  There are no problems to display for this patient.   Past Surgical History:  Procedure Laterality Date  . CHOLECYSTECTOMY       OB History   No obstetric history on file.     No family history on file.     Home Medications Prior to Admission medications   Not on File    Allergies    Patient has no known allergies.  Review of Systems   Review of Systems  Constitutional: Negative for fever.  Respiratory: Negative for shortness of breath.   Cardiovascular: Negative for chest pain.  Gastrointestinal: Negative for abdominal pain.  Genitourinary: Negative for dysuria.  Musculoskeletal:       + Left lower extremity pain  Skin: Negative for color change.  Neurological: Negative for headaches.    Physical Exam Updated Vital Signs BP (!) 123/98   Pulse 77   Temp 98.3 F (36.8 C) (Oral)   Resp 16   Ht 5\' 3"  (1.6 m)   Wt 52.2 kg   LMP 07/23/2020   SpO2 100%   BMI 20.37 kg/m   Physical Exam Vitals and nursing note reviewed.  Constitutional:      Appearance: Normal appearance.  HENT:     Head: Normocephalic.     Mouth/Throat:     Mouth: Mucous membranes are moist.  Cardiovascular:     Rate and Rhythm: Normal rate.  Pulmonary:     Effort: Pulmonary effort is normal. No respiratory distress.  Abdominal:     Palpations: Abdomen is soft.  Musculoskeletal:     Comments: Left lower  extremity appears unremarkable, slight tenderness to palpation of the popliteal fossa but otherwise no other acute findings.  Skin:    General: Skin is warm.  Neurological:     Mental Status: She is alert and oriented to person, place, and time. Mental status is at baseline.  Psychiatric:        Mood and Affect: Mood normal.     ED Results / Procedures / Treatments   Labs (all labs ordered are listed, but only abnormal results are displayed) Labs Reviewed  CBC WITH DIFFERENTIAL/PLATELET - Abnormal; Notable for the following components:      Result Value   WBC 2.5 (*)    Neutro Abs 0.9 (*)    All other components within normal limits  BASIC METABOLIC PANEL - Abnormal; Notable for the following components:   Glucose, Bld 119 (*)    All other components within normal limits  D-DIMER, QUANTITATIVE  I-STAT BETA HCG BLOOD, ED ([facility], [facility], [facility] ONLY)    EKG None  Radiology DG Knee 2 Views Left  Result Date: 07/23/2020 CLINICAL DATA:  Left knee pain for 2 days EXAM: LEFT KNEE - 2 VIEW COMPARISON:  None FINDINGS: No evidence of fracture, dislocation, or joint effusion. No evidence of arthropathy or other  focal bone abnormality. Soft tissues are unremarkable. IMPRESSION: No acute abnormality noted. Electronically Signed   By: Alcide Clever M.D.   On: 07/23/2020 20:56    Procedures Procedures   Medications Ordered in ED Medications - No data to display  ED Course  I have reviewed the triage vital signs and the nursing notes.  Pertinent labs & imaging results that were available during my care of the patient were reviewed by me and considered in my medical decision making (see chart for details).    MDM Rules/Calculators/A&P                          30 year old female presents the emergency department with left lower extremity discomfort.  She has tenderness to palpation the popliteal fossa but otherwise the leg objectively is unremarkable.  Equal palpable DP pulses, she is neuro  intact.  X-ray shows no fracture, D-dimer is negative.  Blood work is otherwise normal.  Will refer outpatient to primary doctor and orthopedics as needed.  Patient will be discharged and treated as an outpatient.  Discharge plan and strict return to ED precautions discussed, patient verbalizes understanding and agreement.  Final Clinical Impression(s) / ED Diagnoses Final diagnoses:  Left leg pain    Rx / DC Orders ED Discharge Orders    None       Rozelle Logan, DO 07/23/20 2229

## 2020-07-23 NOTE — Discharge Instructions (Signed)
You have been seen and discharged from the emergency department.  Your x-rays were negative for fracture, your blood work was negative for DVT.  Follow-up with your primary provider and/or orthopedics for reevaluation and further care. Take home medications as prescribed. If you have any worsening symptoms or further concerns for your health please return to an emergency department for further evaluation.

## 2020-07-23 NOTE — ED Triage Notes (Signed)
Patient c/o pain to left leg with swelling to posterior knee since yesterday. Denies injury. Denies chest pain and SOB. Ambulatory.

## 2020-07-25 DIAGNOSIS — Z419 Encounter for procedure for purposes other than remedying health state, unspecified: Secondary | ICD-10-CM | POA: Diagnosis not present

## 2020-08-01 ENCOUNTER — Emergency Department (HOSPITAL_COMMUNITY)
Admission: EM | Admit: 2020-08-01 | Discharge: 2020-08-02 | Disposition: A | Discharge status: Home / Self Care | Payer: Medicaid Other | Attending: Emergency Medicine | Admitting: Emergency Medicine

## 2020-08-01 DIAGNOSIS — R0602 Shortness of breath: Secondary | ICD-10-CM | POA: Diagnosis present

## 2020-08-01 DIAGNOSIS — U071 COVID-19: Secondary | ICD-10-CM | POA: Insufficient documentation

## 2020-08-01 DIAGNOSIS — F419 Anxiety disorder, unspecified: Secondary | ICD-10-CM | POA: Diagnosis not present

## 2020-08-01 DIAGNOSIS — R079 Chest pain, unspecified: Secondary | ICD-10-CM | POA: Diagnosis not present

## 2020-08-01 DIAGNOSIS — R0600 Dyspnea, unspecified: Secondary | ICD-10-CM | POA: Diagnosis not present

## 2020-08-01 DIAGNOSIS — M549 Dorsalgia, unspecified: Secondary | ICD-10-CM | POA: Diagnosis not present

## 2020-08-02 ENCOUNTER — Emergency Department (HOSPITAL_COMMUNITY): Payer: Medicaid Other

## 2020-08-02 ENCOUNTER — Encounter (HOSPITAL_COMMUNITY): Payer: Self-pay | Admitting: Emergency Medicine

## 2020-08-02 DIAGNOSIS — R079 Chest pain, unspecified: Secondary | ICD-10-CM | POA: Diagnosis not present

## 2020-08-02 DIAGNOSIS — R0600 Dyspnea, unspecified: Secondary | ICD-10-CM | POA: Diagnosis not present

## 2020-08-02 LAB — BASIC METABOLIC PANEL
Anion gap: 10 (ref 5–15)
BUN: 12 mg/dL (ref 6–20)
CO2: 25 mmol/L (ref 22–32)
Calcium: 9.4 mg/dL (ref 8.9–10.3)
Chloride: 105 mmol/L (ref 98–111)
Creatinine, Ser: 0.73 mg/dL (ref 0.44–1.00)
GFR, Estimated: >60 mL/min (ref >60)
Glucose, Bld: 90 mg/dL (ref 70–99)
Potassium: 3.3 mmol/L — ABNORMAL LOW (ref 3.5–5.1)
Sodium: 140 mmol/L (ref 135–145)

## 2020-08-02 LAB — RAPID URINE DRUG SCREEN, HOSP PERFORMED
Amphetamines: NOT DETECTED
Barbiturates: NOT DETECTED
Benzodiazepines: NOT DETECTED
Cocaine: NOT DETECTED
Opiates: NOT DETECTED
Tetrahydrocannabinol: POSITIVE — AB

## 2020-08-02 LAB — URINALYSIS, ROUTINE W REFLEX MICROSCOPIC
Bilirubin Urine: NEGATIVE
Glucose, UA: NEGATIVE mg/dL
Hgb urine dipstick: NEGATIVE
Ketones, ur: 5 mg/dL — AB
Leukocytes,Ua: NEGATIVE
Nitrite: NEGATIVE
Protein, ur: NEGATIVE mg/dL
Specific Gravity, Urine: 1.029 (ref 1.005–1.030)
pH: 6 (ref 5.0–8.0)

## 2020-08-02 LAB — CBC WITH DIFFERENTIAL/PLATELET
Abs Immature Granulocytes: 0.01 10*3/uL (ref 0.00–0.07)
Basophils Absolute: 0 10*3/uL (ref 0.0–0.1)
Basophils Relative: 0 %
Eosinophils Absolute: 0 10*3/uL (ref 0.0–0.5)
Eosinophils Relative: 0 %
HCT: 36.4 % (ref 36.0–46.0)
Hemoglobin: 11.9 g/dL — ABNORMAL LOW (ref 12.0–15.0)
Immature Granulocytes: 0 %
Lymphocytes Relative: 29 %
Lymphs Abs: 2 10*3/uL (ref 0.7–4.0)
MCH: 28.6 pg (ref 26.0–34.0)
MCHC: 32.7 g/dL (ref 30.0–36.0)
MCV: 87.5 fL (ref 80.0–100.0)
Monocytes Absolute: 0.9 10*3/uL (ref 0.1–1.0)
Monocytes Relative: 13 %
Neutro Abs: 4 10*3/uL (ref 1.7–7.7)
Neutrophils Relative %: 58 %
Platelets: 414 10*3/uL — ABNORMAL HIGH (ref 150–400)
RBC: 4.16 MIL/uL (ref 3.87–5.11)
RDW: 13.7 % (ref 11.5–15.5)
WBC: 6.9 10*3/uL (ref 4.0–10.5)
nRBC: 0 % (ref 0.0–0.2)

## 2020-08-02 LAB — PREGNANCY, URINE: Preg Test, Ur: NEGATIVE

## 2020-08-02 LAB — RESP PANEL BY RT-PCR (FLU A&B, COVID) ARPGX2
Influenza A by PCR: NEGATIVE
Influenza B by PCR: NEGATIVE
SARS Coronavirus 2 by RT PCR: POSITIVE — AB

## 2020-08-02 LAB — CBG MONITORING, ED: Glucose-Capillary: 79 mg/dL (ref 70–99)

## 2020-08-02 LAB — ETHANOL: Alcohol, Ethyl (B): <10 mg/dL (ref <10)

## 2020-08-02 NOTE — ED Provider Notes (Signed)
[facility]-EMERGENCY DEPT Provider Note   CSN: [id number] Arrival date & time: 08/01/20  2322     History Chief Complaint  Patient presents with   Back Pain    Amy Jefferson is a 30 y.o. female.  Patient presents to the emergency department with a chief complaint of back pain and shortness of breath.  She states that this started earlier today.  She also reports feeling confused and anxious.  She denies any drug or alcohol use.  Denies fever, chills, cough.  Denies any treatment prior to arrival.  Denies any past medical problems.  The history is provided by the patient. No language interpreter was used. 6     No past medical history on file.  There are no problems to display for this patient.   Past Surgical History:  Procedure Laterality Date   CHOLECYSTECTOMY       OB History   No obstetric history on file.     No family history on file.     Home Medications Prior to Admission medications   Not on File    Allergies    Patient has no known allergies.  Review of Systems   Review of Systems  All other systems reviewed and are negative.  Physical Exam Updated Vital Signs BP (!) 136/113   Pulse 70   Temp 98.4 F (36.9 C) (Oral)   Resp 18   Ht 5\' 3"  (1.6 m)   Wt 52.2 kg   LMP 07/23/2020   SpO2 100%   BMI 20.37 kg/m   Physical Exam Vitals and nursing note reviewed.  Constitutional:      General: She is not in acute distress.    Appearance: She is well-developed.  HENT:     Head: Normocephalic and atraumatic.  Eyes:     Conjunctiva/sclera: Conjunctivae normal.  Cardiovascular:     Rate and Rhythm: Normal rate and regular rhythm.     Heart sounds: No murmur heard. Pulmonary:     Effort: Pulmonary effort is normal. No respiratory distress.     Breath sounds: Normal breath sounds.  Abdominal:     Palpations: Abdomen is soft.     Tenderness: There is no abdominal tenderness.  Musculoskeletal:        General: Normal range  of motion.     Cervical back: Neck supple.  Skin:    General: Skin is warm and dry.  Neurological:     Mental Status: She is alert and oriented to person, place, and time.  Psychiatric:        Mood and Affect: Mood normal.        Behavior: Behavior normal.    ED Results / Procedures / Treatments   Labs (all labs ordered are listed, but only abnormal results are displayed) Labs Reviewed  RESP PANEL BY RT-PCR (FLU A&B, COVID) ARPGX2  CBC WITH DIFFERENTIAL/PLATELET  BASIC METABOLIC PANEL  RAPID URINE DRUG SCREEN, HOSP PERFORMED  URINALYSIS, ROUTINE W REFLEX MICROSCOPIC  PREGNANCY, URINE  ETHANOL  CBG MONITORING, ED    EKG None  Radiology No results found.  Procedures Procedures   Medications Ordered in ED Medications - No data to display  ED Course  I have reviewed the triage vital signs and the nursing notes.  Pertinent labs & imaging results that were available during my care of the patient were reviewed by me and considered in my medical decision making (see chart for details).    MDM Rules/Calculators/A&P  Patient states that she has some back pain or shortness of breath.  Denies any fevers.  She states symptoms just started today.  Denies any known sick contacts.    Vital signs are stable.  She is afebrile here.  She is not hypoxic nor tachycardic.  Laboratory work-up is thus far reassuring, with the exception that she is COVID-positive.  I advised the patient of her positive COVID status.    Patient to quarantine at home.  She understands and agrees the plan.  She is stable ready for discharge.   Final Clinical Impression(s) / ED Diagnoses Final diagnoses:  COVID-19    Rx / DC Orders ED Discharge Orders     None        Roxy Horseman, PA-C 08/02/20 1610    Geoffery Lyons, MD 08/02/20 [phone]

## 2020-08-02 NOTE — ED Triage Notes (Signed)
Patient is complaining of mid back pain and sob. Patient states this started 08/02/2020.

## 2020-08-09 DIAGNOSIS — M25562 Pain in left knee: Secondary | ICD-10-CM | POA: Diagnosis not present

## 2020-08-24 DIAGNOSIS — Z419 Encounter for procedure for purposes other than remedying health state, unspecified: Secondary | ICD-10-CM | POA: Diagnosis not present

## 2020-09-12 DIAGNOSIS — M79604 Pain in right leg: Secondary | ICD-10-CM | POA: Diagnosis not present

## 2020-09-12 DIAGNOSIS — R102 Pelvic and perineal pain: Secondary | ICD-10-CM | POA: Diagnosis not present

## 2020-09-12 DIAGNOSIS — M25562 Pain in left knee: Secondary | ICD-10-CM | POA: Diagnosis not present

## 2020-09-12 DIAGNOSIS — M79659 Pain in unspecified thigh: Secondary | ICD-10-CM | POA: Diagnosis not present

## 2020-09-13 ENCOUNTER — Ambulatory Visit (HOSPITAL_COMMUNITY)
Admission: RE | Admit: 2020-09-13 | Discharge: 2020-09-13 | Disposition: A | Discharge status: Home / Self Care | Payer: Medicaid Other | Source: Ambulatory Visit | Attending: Cardiology | Admitting: Cardiology

## 2020-09-13 ENCOUNTER — Other Ambulatory Visit (HOSPITAL_COMMUNITY): Payer: Self-pay | Admitting: Orthopedic Surgery

## 2020-09-13 ENCOUNTER — Other Ambulatory Visit: Payer: Self-pay

## 2020-09-13 DIAGNOSIS — M79662 Pain in left lower leg: Secondary | ICD-10-CM | POA: Diagnosis not present

## 2020-09-13 DIAGNOSIS — M7989 Other specified soft tissue disorders: Secondary | ICD-10-CM

## 2020-09-24 DIAGNOSIS — Z419 Encounter for procedure for purposes other than remedying health state, unspecified: Secondary | ICD-10-CM | POA: Diagnosis not present

## 2020-10-25 DIAGNOSIS — Z419 Encounter for procedure for purposes other than remedying health state, unspecified: Secondary | ICD-10-CM | POA: Diagnosis not present

## 2020-11-09 ENCOUNTER — Ambulatory Visit (INDEPENDENT_AMBULATORY_CARE_PROVIDER_SITE_OTHER): Payer: Medicaid Other

## 2020-11-09 ENCOUNTER — Other Ambulatory Visit: Payer: Self-pay

## 2020-11-09 ENCOUNTER — Ambulatory Visit
Admission: EM | Admit: 2020-11-09 | Discharge: 2020-11-09 | Disposition: A | Discharge status: Home / Self Care | Payer: Medicaid Other | Attending: Urgent Care | Admitting: Urgent Care

## 2020-11-09 DIAGNOSIS — S92491A Other fracture of right great toe, initial encounter for closed fracture: Secondary | ICD-10-CM

## 2020-11-09 DIAGNOSIS — M7989 Other specified soft tissue disorders: Secondary | ICD-10-CM | POA: Diagnosis not present

## 2020-11-09 DIAGNOSIS — M79674 Pain in right toe(s): Secondary | ICD-10-CM

## 2020-11-09 DIAGNOSIS — S92424A Nondisplaced fracture of distal phalanx of right great toe, initial encounter for closed fracture: Secondary | ICD-10-CM | POA: Diagnosis not present

## 2020-11-09 MED ORDER — NAPROXEN 500 MG PO TABS: 500.0000 mg | ORAL_TABLET | Freq: Two times a day (BID) | ORAL | 0 refills | Status: DC
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
## 2020-11-20 ENCOUNTER — Ambulatory Visit: Payer: Medicaid Other | Admitting: Podiatry

## 2020-11-24 DIAGNOSIS — Z419 Encounter for procedure for purposes other than remedying health state, unspecified: Secondary | ICD-10-CM | POA: Diagnosis not present

## 2020-12-17 ENCOUNTER — Other Ambulatory Visit: Payer: Self-pay

## 2020-12-18 ENCOUNTER — Emergency Department (HOSPITAL_BASED_OUTPATIENT_CLINIC_OR_DEPARTMENT_OTHER)
Admission: EM | Admit: 2020-12-18 | Discharge: 2020-12-19 | Disposition: A | Discharge status: Home / Self Care | Payer: Medicaid Other | Attending: Emergency Medicine | Admitting: Emergency Medicine

## 2020-12-18 ENCOUNTER — Emergency Department (HOSPITAL_BASED_OUTPATIENT_CLINIC_OR_DEPARTMENT_OTHER): Payer: Medicaid Other

## 2020-12-18 ENCOUNTER — Encounter (HOSPITAL_BASED_OUTPATIENT_CLINIC_OR_DEPARTMENT_OTHER): Payer: Self-pay

## 2020-12-18 ENCOUNTER — Other Ambulatory Visit: Payer: Self-pay

## 2020-12-18 DIAGNOSIS — R109 Unspecified abdominal pain: Secondary | ICD-10-CM | POA: Insufficient documentation

## 2020-12-18 DIAGNOSIS — M25559 Pain in unspecified hip: Secondary | ICD-10-CM | POA: Diagnosis not present

## 2020-12-18 DIAGNOSIS — S6991XA Unspecified injury of right wrist, hand and finger(s), initial encounter: Secondary | ICD-10-CM | POA: Diagnosis not present

## 2020-12-18 DIAGNOSIS — M549 Dorsalgia, unspecified: Secondary | ICD-10-CM | POA: Diagnosis not present

## 2020-12-18 DIAGNOSIS — T07XXXA Unspecified multiple injuries, initial encounter: Secondary | ICD-10-CM

## 2020-12-18 DIAGNOSIS — Z0471 Encounter for examination and observation following alleged adult physical abuse: Secondary | ICD-10-CM | POA: Diagnosis not present

## 2020-12-18 DIAGNOSIS — M542 Cervicalgia: Secondary | ICD-10-CM | POA: Insufficient documentation

## 2020-12-18 DIAGNOSIS — S60211A Contusion of right wrist, initial encounter: Secondary | ICD-10-CM | POA: Diagnosis not present

## 2020-12-18 LAB — PREGNANCY, URINE: Preg Test, Ur: NEGATIVE

## 2020-12-18 MED ORDER — ACETAMINOPHEN 500 MG PO TABS
1000.0000 mg | ORAL_TABLET | Freq: Once | ORAL | Status: AC
  Administered 2020-12-18: 1000 mg via ORAL
  Filled 2020-12-18: qty 2
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
## 2020-12-19 DIAGNOSIS — S6991XA Unspecified injury of right wrist, hand and finger(s), initial encounter: Secondary | ICD-10-CM | POA: Diagnosis not present

## 2020-12-25 DIAGNOSIS — Z419 Encounter for procedure for purposes other than remedying health state, unspecified: Secondary | ICD-10-CM | POA: Diagnosis not present

## 2021-01-24 DIAGNOSIS — Z419 Encounter for procedure for purposes other than remedying health state, unspecified: Secondary | ICD-10-CM | POA: Diagnosis not present

## 2021-02-24 DIAGNOSIS — Z419 Encounter for procedure for purposes other than remedying health state, unspecified: Secondary | ICD-10-CM | POA: Diagnosis not present

## 2021-03-27 DIAGNOSIS — Z419 Encounter for procedure for purposes other than remedying health state, unspecified: Secondary | ICD-10-CM | POA: Diagnosis not present

## 2021-04-04 ENCOUNTER — Other Ambulatory Visit: Payer: Self-pay

## 2021-04-04 ENCOUNTER — Encounter: Payer: Self-pay | Admitting: Emergency Medicine

## 2021-04-04 ENCOUNTER — Ambulatory Visit
Admission: EM | Admit: 2021-04-04 | Discharge: 2021-04-04 | Disposition: A | Discharge status: Home / Self Care | Payer: Medicaid Other | Attending: Internal Medicine | Admitting: Internal Medicine

## 2021-04-04 DIAGNOSIS — R109 Unspecified abdominal pain: Secondary | ICD-10-CM

## 2021-04-04 DIAGNOSIS — B009 Herpesviral infection, unspecified: Secondary | ICD-10-CM

## 2021-04-04 LAB — POCT URINE PREGNANCY: Preg Test, Ur: NEGATIVE

## 2021-04-04 LAB — POCT URINALYSIS DIP (MANUAL ENTRY)
Bilirubin, UA: NEGATIVE
Blood, UA: NEGATIVE
Glucose, UA: NEGATIVE mg/dL
Ketones, POC UA: NEGATIVE mg/dL
Leukocytes, UA: NEGATIVE
Nitrite, UA: NEGATIVE
Protein Ur, POC: NEGATIVE mg/dL
Spec Grav, UA: 1.02 (ref 1.010–1.025)
Urobilinogen, UA: 0.2 E.U./dL
pH, UA: 6.5 (ref 5.0–8.0)

## 2021-04-04 MED ORDER — ACYCLOVIR 400 MG PO TABS: 800.0000 mg | ORAL_TABLET | Freq: Two times a day (BID) | ORAL | 0 refills | Status: AC

## 2021-04-04 NOTE — ED Provider Notes (Signed)
[facility]    CSN: [id number] Arrival date & time: 04/04/21  1435      History   Chief Complaint No chief complaint on file.   HPI Amy Jefferson is a 31 y.o. female.   Patient here today for evaluation of left-sided flank pain that started about 3 days ago.  She reports that pain radiates into her left upper abdomen.  She feels that symptoms have worsened since onset.  She states she feels very fatigued and lethargic.  She has not had fever.  She notes that urine has looked different since the onset of pain.  She has history of abdominal pain and reports she has had colonoscopy and other work-up.  She also reports that she needs a refill of her acyclovir as she has had a flare of herpes since symptoms started.  The history is provided by the patient.   History reviewed. No pertinent past medical history.  There are no problems to display for this patient.   Past Surgical History:  Procedure Laterality Date   CHOLECYSTECTOMY      OB History   No obstetric history on file.      Home Medications    Prior to Admission medications   Medication Sig Start Date End Date Taking? Authorizing Provider  acyclovir (ZOVIRAX) 400 MG tablet Take 2 tablets (800 mg total) by mouth 2 (two) times daily for 5 days. 04/04/21 04/09/21 Yes Tomi Bamberger, PA-C  naproxen (NAPROSYN) 500 MG tablet Take 1 tablet (500 mg total) by mouth 2 (two) times daily with a meal. 11/09/20   Wallis Bamberg, PA-C    Family History History reviewed. No pertinent family history.  Social History Social History   Tobacco Use   Smoking status: Never   Smokeless tobacco: Never  Vaping Use   Vaping Use: Never used  Substance Use Topics   Alcohol use: Yes    Comment: occ   Drug use: Yes    Types: Marijuana     Allergies   Lactose   Review of Systems Review of Systems  Constitutional:  Negative for chills and fever.  Gastrointestinal:  Positive for abdominal pain. Negative for nausea  and vomiting.  Genitourinary:  Positive for flank pain. Negative for dysuria and frequency.  Musculoskeletal:  Positive for back pain.    Physical Exam Triage Vital Signs ED Triage Vitals  Enc Vitals Group     BP 04/04/21 1558 126/66     Pulse Rate 04/04/21 1558 76     Resp 04/04/21 1558 16     Temp 04/04/21 1558 98 F (36.7 C)     Temp Source 04/04/21 1558 Oral     SpO2 04/04/21 1558 98 %     Weight --      Height --      Head Circumference --      Peak Flow --      Pain Score 04/04/21 1559 8     Pain Loc --      Pain Edu? --      Excl. in GC? --    No data found.  Updated Vital Signs BP 126/66 (BP Location: Left Arm)    Pulse 76    Temp 98 F (36.7 C) (Oral)    Resp 16    SpO2 98%      Physical Exam Vitals and nursing note reviewed.  Constitutional:      General: She is not in acute distress.    Appearance: Normal appearance.  She is not ill-appearing.  HENT:     Head: Normocephalic and atraumatic.     Nose: Nose normal.  Cardiovascular:     Rate and Rhythm: Normal rate and regular rhythm.     Heart sounds: Normal heart sounds. No murmur heard. Pulmonary:     Effort: Pulmonary effort is normal. No respiratory distress.     Breath sounds: Normal breath sounds. No wheezing, rhonchi or rales.  Abdominal:     General: Abdomen is flat. Bowel sounds are normal. There is no distension.     Palpations: Abdomen is soft.     Tenderness: There is abdominal tenderness (mild TTP to LUQ). There is no guarding.  Skin:    General: Skin is warm and dry.  Neurological:     Mental Status: She is alert.  Psychiatric:        Mood and Affect: Mood normal.        Thought Content: Thought content normal.     UC Treatments / Results  Labs (all labs ordered are listed, but only abnormal results are displayed) Labs Reviewed  CBC WITH DIFFERENTIAL/PLATELET  COMPREHENSIVE METABOLIC PANEL  H. PYLORI ANTIBODY, IGG  POCT URINALYSIS DIP (MANUAL ENTRY)  POCT URINE PREGNANCY     EKG   Radiology No results found.  Procedures Procedures (including critical care time)  Medications Ordered in UC Medications - No data to display  Initial Impression / Assessment and Plan / UC Course  I have reviewed the triage vital signs and the nursing notes.  Pertinent labs & imaging results that were available during my care of the patient were reviewed by me and considered in my medical decision making (see chart for details).    Routine labs ordered. UA was normal. Upreg was negative. Recommended further evaluation in the ED if symptoms worsen in any way. Patient expresses understanding.  Acyclovir refilled as requested.   Final Clinical Impressions(s) / UC Diagnoses   Final diagnoses:  Flank pain  Herpes simplex   Discharge Instructions   None    ED Prescriptions     Medication Sig Dispense Auth. Provider   acyclovir (ZOVIRAX) 400 MG tablet Take 2 tablets (800 mg total) by mouth 2 (two) times daily for 5 days. 20 tablet Tomi Bamberger, PA-C      PDMP not reviewed this encounter.   Tomi Bamberger, PA-C 04/04/21 1827

## 2021-04-04 NOTE — ED Triage Notes (Signed)
Left sided flank pain radiating into left stomach x 3 days, gradually increasing. Woke up this morning and pain has become more severe to the point she's now fatigued and "doesn't feel right." Also states that due to the pain and stress, she's had a flare up of her genital herpes and is requesting the medication for that. States her urine has looked different since the start of the pain.

## 2021-04-05 LAB — H. PYLORI ANTIBODY, IGG: H. pylori, IgG AbS: 0.14 Index Value (ref 0.00–0.79)

## 2021-04-05 LAB — CBC WITH DIFFERENTIAL/PLATELET
Basophils Absolute: 0 10*3/uL (ref 0.0–0.2)
Basos: 0 %
EOS (ABSOLUTE): 0 10*3/uL (ref 0.0–0.4)
Eos: 0 %
Hematocrit: 42.5 % (ref 34.0–46.6)
Hemoglobin: 14.1 g/dL (ref 11.1–15.9)
Immature Grans (Abs): 0 10*3/uL (ref 0.0–0.1)
Immature Granulocytes: 0 %
Lymphocytes Absolute: 2 10*3/uL (ref 0.7–3.1)
Lymphs: 21 %
MCH: 28.7 pg (ref 26.6–33.0)
MCHC: 33.2 g/dL (ref 31.5–35.7)
MCV: 87 fL (ref 79–97)
Monocytes Absolute: 0.6 10*3/uL (ref 0.1–0.9)
Monocytes: 7 %
Neutrophils Absolute: 6.7 10*3/uL (ref 1.4–7.0)
Neutrophils: 72 %
Platelets: 349 10*3/uL (ref 150–450)
RBC: 4.91 x10E6/uL (ref 3.77–5.28)
RDW: 14 % (ref 11.7–15.4)
WBC: 9.3 10*3/uL (ref 3.4–10.8)

## 2021-04-05 LAB — COMPREHENSIVE METABOLIC PANEL
ALT: 12 IU/L (ref 0–32)
AST: 15 IU/L (ref 0–40)
Albumin/Globulin Ratio: 1.8 (ref 1.2–2.2)
Albumin: 4.9 g/dL (ref 3.9–5.0)
Alkaline Phosphatase: 72 IU/L (ref 44–121)
BUN/Creatinine Ratio: 21 (ref 9–23)
BUN: 15 mg/dL (ref 6–20)
Bilirubin Total: 0.2 mg/dL (ref 0.0–1.2)
CO2: 22 mmol/L (ref 20–29)
Calcium: 9.9 mg/dL (ref 8.7–10.2)
Chloride: 100 mmol/L (ref 96–106)
Creatinine, Ser: 0.73 mg/dL (ref 0.57–1.00)
Globulin, Total: 2.8 g/dL (ref 1.5–4.5)
Glucose: 79 mg/dL (ref 70–99)
Potassium: 4.1 mmol/L (ref 3.5–5.2)
Sodium: 136 mmol/L (ref 134–144)
Total Protein: 7.7 g/dL (ref 6.0–8.5)
eGFR: 114 mL/min/{1.73_m2} (ref >59)

## 2021-04-06 ENCOUNTER — Telehealth: Payer: Self-pay | Admitting: Emergency Medicine

## 2021-04-24 ENCOUNTER — Other Ambulatory Visit (HOSPITAL_BASED_OUTPATIENT_CLINIC_OR_DEPARTMENT_OTHER): Payer: Self-pay

## 2021-04-24 ENCOUNTER — Emergency Department (HOSPITAL_BASED_OUTPATIENT_CLINIC_OR_DEPARTMENT_OTHER)
Admission: EM | Admit: 2021-04-24 | Discharge: 2021-04-24 | Disposition: A | Discharge status: Home / Self Care | Payer: Medicaid Other | Attending: Emergency Medicine | Admitting: Emergency Medicine

## 2021-04-24 ENCOUNTER — Other Ambulatory Visit: Payer: Self-pay

## 2021-04-24 ENCOUNTER — Emergency Department (HOSPITAL_BASED_OUTPATIENT_CLINIC_OR_DEPARTMENT_OTHER): Payer: Medicaid Other | Admitting: Radiology

## 2021-04-24 ENCOUNTER — Encounter (HOSPITAL_BASED_OUTPATIENT_CLINIC_OR_DEPARTMENT_OTHER): Payer: Self-pay | Admitting: Emergency Medicine

## 2021-04-24 DIAGNOSIS — R509 Fever, unspecified: Secondary | ICD-10-CM | POA: Diagnosis not present

## 2021-04-24 DIAGNOSIS — J069 Acute upper respiratory infection, unspecified: Secondary | ICD-10-CM | POA: Diagnosis not present

## 2021-04-24 DIAGNOSIS — Z20822 Contact with and (suspected) exposure to covid-19: Secondary | ICD-10-CM | POA: Diagnosis not present

## 2021-04-24 DIAGNOSIS — R079 Chest pain, unspecified: Secondary | ICD-10-CM | POA: Insufficient documentation

## 2021-04-24 DIAGNOSIS — R059 Cough, unspecified: Secondary | ICD-10-CM | POA: Diagnosis not present

## 2021-04-24 DIAGNOSIS — B349 Viral infection, unspecified: Secondary | ICD-10-CM | POA: Diagnosis not present

## 2021-04-24 DIAGNOSIS — Z419 Encounter for procedure for purposes other than remedying health state, unspecified: Secondary | ICD-10-CM | POA: Diagnosis not present

## 2021-04-24 DIAGNOSIS — N9489 Other specified conditions associated with female genital organs and menstrual cycle: Secondary | ICD-10-CM | POA: Insufficient documentation

## 2021-04-24 LAB — RESP PANEL BY RT-PCR (FLU A&B, COVID) ARPGX2
Influenza A by PCR: NEGATIVE
Influenza B by PCR: NEGATIVE
SARS Coronavirus 2 by RT PCR: NEGATIVE

## 2021-04-24 LAB — CBC
HCT: 35.1 % — ABNORMAL LOW (ref 36.0–46.0)
Hemoglobin: 11.7 g/dL — ABNORMAL LOW (ref 12.0–15.0)
MCH: 28.6 pg (ref 26.0–34.0)
MCHC: 33.3 g/dL (ref 30.0–36.0)
MCV: 85.8 fL (ref 80.0–100.0)
Platelets: 280 10*3/uL (ref 150–400)
RBC: 4.09 MIL/uL (ref 3.87–5.11)
RDW: 13.8 % (ref 11.5–15.5)
WBC: 10.2 10*3/uL (ref 4.0–10.5)
nRBC: 0 % (ref 0.0–0.2)

## 2021-04-24 LAB — HCG, QUANTITATIVE, PREGNANCY: hCG, Beta Chain, Quant, S: <1 m[IU]/mL (ref <5)

## 2021-04-24 LAB — BASIC METABOLIC PANEL
Anion gap: 9 (ref 5–15)
BUN: 8 mg/dL (ref 6–20)
CO2: 27 mmol/L (ref 22–32)
Calcium: 9.3 mg/dL (ref 8.9–10.3)
Chloride: 103 mmol/L (ref 98–111)
Creatinine, Ser: 0.57 mg/dL (ref 0.44–1.00)
GFR, Estimated: >60 mL/min (ref >60)
Glucose, Bld: 117 mg/dL — ABNORMAL HIGH (ref 70–99)
Potassium: 4.1 mmol/L (ref 3.5–5.1)
Sodium: 139 mmol/L (ref 135–145)

## 2021-04-24 LAB — PREGNANCY, URINE: Preg Test, Ur: NEGATIVE

## 2021-04-24 LAB — TROPONIN I (HIGH SENSITIVITY): Troponin I (High Sensitivity): <2 ng/L (ref <18)

## 2021-04-24 MED ORDER — BENZONATATE 100 MG PO CAPS
100.0000 mg | ORAL_CAPSULE | Freq: Three times a day (TID) | ORAL | 0 refills | Status: DC
  Filled 2021-04-24: qty 21, 7d supply, fill #0

## 2021-04-24 NOTE — ED Triage Notes (Signed)
Pt states sore throat, coughing up bloody mucus, chest tingling/burning, SOB and brain foggy x2 days ?

## 2021-04-24 NOTE — ED Provider Notes (Signed)
[facility] EMERGENCY DEPT Provider Note   CSN: [id number] Arrival date & time: 04/24/21  1123     History  Chief Complaint  Patient presents with   Fever   Cough    Amy Jefferson is a 31 y.o. female.  Patient with no pertinent past medical history presents today with chief complaint of chest pain.  She states that same has been ongoing for some time now but has been worsening in the past 6 weeks. She states that it is intermittent in nature, is located substernally and does not radiate. It is not reproducible to palpation. She states that it is triggered by caffeine and exercise. She denies any shortness of breath or leg pain. No recent travel, history of blood clots, history of malignancy, recent surgeries.  She is not on oral contraceptives. No hx of cardiac problems, she has not been evaluated for these symptoms in the past. Patient states that she does not currently smoke, she stopped a year ago, denies alcohol use, recreational drug use, or IVDU.  Additionally, patient presents with 5 days of URI symptoms including cough, congestion, and rhinorrhea.  She presents today with her 3 children who are sick with similar symptoms.  She has been taking Tylenol for pain and fevers.  She denies any shortness of breath.  The history is provided by the patient. No language interpreter was used.  Fever Associated symptoms: chest pain, congestion and cough   Associated symptoms: no chills, no confusion, no diarrhea, no ear pain, no headaches, no nausea, no rash, no rhinorrhea, no sore throat and no vomiting   Cough Associated symptoms: chest pain and fever   Associated symptoms: no chills, no ear pain, no headaches, no rash, no rhinorrhea, no shortness of breath, no sore throat and no wheezing       Home Medications Prior to Admission medications   Medication Sig Start Date End Date Taking? Authorizing Provider  naproxen (NAPROSYN) 500 MG tablet Take 1 tablet (500 mg total) by  mouth 2 (two) times daily with a meal. 11/09/20   Wallis Bamberg, PA-C      Allergies    Lactose    Review of Systems   Review of Systems  Constitutional:  Positive for fever. Negative for activity change, appetite change and chills.  HENT:  Positive for congestion. Negative for ear pain, rhinorrhea, sore throat, tinnitus, trouble swallowing and voice change.   Respiratory:  Positive for cough. Negative for shortness of breath, wheezing and stridor.   Cardiovascular:  Positive for chest pain. Negative for leg swelling.  Gastrointestinal:  Negative for abdominal pain, diarrhea, nausea and vomiting.  Musculoskeletal:  Negative for back pain, neck pain and neck stiffness.  Skin:  Negative for rash.  Neurological:  Negative for dizziness, tremors, seizures, syncope, facial asymmetry, speech difficulty, weakness, light-headedness, numbness and headaches.  Psychiatric/Behavioral:  Negative for confusion.   All other systems reviewed and are negative.  Physical Exam Updated Vital Signs BP 119/76    Pulse 85    Temp 98.5 F (36.9 C) (Oral)    Resp 17    Ht 5\' 3"  (1.6 m)    Wt 52.2 kg    LMP 04/10/2021 (Approximate)    SpO2 99%    BMI 20.37 kg/m  Physical Exam Vitals and nursing note reviewed.  Constitutional:      General: She is not in acute distress.    Appearance: Normal appearance. She is normal weight. She is not ill-appearing, toxic-appearing or diaphoretic.  Comments: Patient resting comfortably in bed in no acute distress  HENT:     Head: Normocephalic and atraumatic.     Right Ear: Tympanic membrane, ear canal and external ear normal.     Left Ear: Tympanic membrane, ear canal and external ear normal.     Nose: Nose normal.     Mouth/Throat:     Mouth: Mucous membranes are moist.  Eyes:     Extraocular Movements: Extraocular movements intact.     Pupils: Pupils are equal, round, and reactive to light.  Cardiovascular:     Rate and Rhythm: Normal rate and regular rhythm.      Pulses: Normal pulses.     Heart sounds: Normal heart sounds.  Pulmonary:     Effort: Pulmonary effort is normal. No respiratory distress.     Breath sounds: Normal breath sounds. No wheezing or rales.  Abdominal:     General: Abdomen is flat.     Palpations: Abdomen is soft.     Tenderness: There is no abdominal tenderness.  Musculoskeletal:        General: No tenderness. Normal range of motion.     Cervical back: Normal range of motion and neck supple. No tenderness.     Right lower leg: No edema.     Left lower leg: No edema.  Skin:    General: Skin is warm and dry.  Neurological:     General: No focal deficit present.     Mental Status: She is alert.  Psychiatric:        Mood and Affect: Mood normal.        Behavior: Behavior normal.    ED Results / Procedures / Treatments   Labs (all labs ordered are listed, but only abnormal results are displayed) Labs Reviewed  BASIC METABOLIC PANEL - Abnormal; Notable for the following components:      Result Value   Glucose, Bld 117 (*)    All other components within normal limits  CBC - Abnormal; Notable for the following components:   Hemoglobin 11.7 (*)    HCT 35.1 (*)    All other components within normal limits  RESP PANEL BY RT-PCR (FLU A&B, COVID) ARPGX2  PREGNANCY, URINE  HCG, QUANTITATIVE, PREGNANCY  TROPONIN I (HIGH SENSITIVITY)  TROPONIN I (HIGH SENSITIVITY)    EKG None  Radiology DG Chest 2 View  Result Date: 04/24/2021 CLINICAL DATA:  Chest pain, fever and cough. EXAM: CHEST - 2 VIEW COMPARISON:  Radiographs 08/02/2020. FINDINGS: The heart size and mediastinal contours are normal. The lungs are clear. There is no pleural effusion or pneumothorax. No acute osseous findings are identified. IMPRESSION: Stable chest.  No active cardiopulmonary process. Electronically Signed   By: Carey Bullocks M.D.   On: 04/24/2021 13:05    Procedures Procedures    Medications Ordered in ED Medications - No data to  display  ED Course/ Medical Decision Making/ A&P                           Medical Decision Making Amount and/or Complexity of Data Reviewed Labs: ordered. Radiology: ordered.  Risk Prescription drug management.   This patient presents to the ED for concern of chest pain, this involves an extensive number of treatment options, and is a complaint that carries with it a high risk of complications and morbidity.   Co morbidities that complicate the patient evaluation  none   Lab Tests:  I Ordered,  and personally interpreted labs.  The pertinent results include:  COVID/flu negative. No abnormal laboratory findings   Imaging Studies ordered:  I ordered imaging studies including CXR  I independently visualized and interpreted imaging which showed  No active cardiopulmonary process I agree with the radiologist interpretation   Cardiac Monitoring:  The patient was maintained on a cardiac monitor.  I personally viewed and interpreted the cardiac monitored which showed an underlying rhythm of: NSR, no STEMI   Social Determinants of Health:  No pcp   Given the large differential diagnosis for Malcolm Agro, the decision making in this case is of high complexity.  After evaluating all of the data points in this case, the presentation of Paylin Heavin is NOT consistent with Acute Coronary Syndrome (ACS) and/or myocardial ischemia, pulmonary embolism, aortic dissection; Borhaave's, significant arrythmia, pneumothorax, cardiac tamponade, or other emergent cardiopulmonary condition.  Further, the presentation of Cloteal Dunnell is NOT consistent with pericarditis, myocarditis, cholecystitis, pancreatitis, mediastinitis, endocarditis, new valvular disease.  Additionally, the presentation of Tuwanna Bryantis NOT consistent with flail chest, cardiac contusion, ARDS, or significant intra-thoracic or intra-abdominal bleeding.  Moreover, this presentation is NOT consistent with pneumonia,  sepsis, or pyelonephritis.  The patient has a   heart score of 1, very low risk for cardiac events. She is currently pain free.  She is afebrile, nontoxic-appearing, and in no acute distress with reassuring vital signs, benign physical exam, and benign findings today.  Therefore feel that she is stable for discharge at this time with close PCP follow-up.  I have emphasized with the patient the importance of establishing care with a PCP and she is understanding.  I have also given her information about the wellness center where she can follow-up.  She is understanding and amenable with plan, educated on red flag symptoms of prompt immediate return.  Discharged in stable condition.   Data Reviewed/Counseling: I have reviewed the patient's vital signs, nursing notes, and other relevant tests/information. I had a detailed discussion regarding the historical points, exam findings, and any diagnostic results supporting the discharge diagnosis. I also discussed the need for outpatient follow-up and the need to return to the ED if symptoms worsen or if there are any questions or concerns that arise at home.   Final Clinical Impression(s) / ED Diagnoses Final diagnoses:  Chest pain, unspecified type  Viral URI with cough    Rx / DC Orders ED Discharge Orders     None     An After Visit Summary was printed and given to the patient.     Vear Clock 04/24/21 2031    Margarita Grizzle, MD 04/25/21 [phone]

## 2021-04-24 NOTE — Discharge Instructions (Addendum)
As we discussed, your work-up in the ER today was reassuring for acute abnormalities.  It is very important that you establish care with a primary care doctor who can evaluate you regularly.  Given you information for the wellness center where you can follow-up if you are unable to establish care with a primary doctor. ? ?I have also given you a prescription for cough suppressant medication for you to take as needed for your symptoms.  I also recommend Tylenol/ibuprofen for pain and fevers. ? ?Return if development of any new or worsening symptoms. ?

## 2021-05-25 DIAGNOSIS — Z419 Encounter for procedure for purposes other than remedying health state, unspecified: Secondary | ICD-10-CM | POA: Diagnosis not present

## 2021-06-17 ENCOUNTER — Encounter: Payer: Self-pay | Admitting: Family Medicine

## 2021-06-17 ENCOUNTER — Ambulatory Visit: Payer: Medicaid Other | Attending: Family Medicine | Admitting: Family Medicine

## 2021-06-17 VITALS — BP 102/68 | HR 87 | Ht 63.0 in | Wt 122.0 lb

## 2021-06-17 DIAGNOSIS — R1012 Left upper quadrant pain: Secondary | ICD-10-CM

## 2021-06-17 DIAGNOSIS — R109 Unspecified abdominal pain: Secondary | ICD-10-CM | POA: Diagnosis not present

## 2021-06-17 DIAGNOSIS — Z8249 Family history of ischemic heart disease and other diseases of the circulatory system: Secondary | ICD-10-CM | POA: Diagnosis not present

## 2021-06-17 DIAGNOSIS — R002 Palpitations: Secondary | ICD-10-CM | POA: Diagnosis not present

## 2021-06-17 NOTE — Progress Notes (Signed)
? ?Subjective:  ?Patient ID: Amy Jefferson, female    DOB: September 04, 1990  Age: 31 y.o. MRN: [id number] ? ?CC: Hospitalization Follow-up ? ? ?HPI ?Amy Jefferson is a 31 y.o. year old female who presents to establish care. ?She had an ED visit for chest pain last month, EKG unrevealing and ACS was ruled out.  1 month prior to that she had presented to the ED with flank pain, UA, pregnancy test were negative. ? ?Interval History: ?She has had palpitations which she would hear in her heart, ears and would have to turn on the A/c in her car. It occurs 4-5 days/weeks and sometimes she has to lie on the bed and breathe. She denies being stressed or anxious Symptoms have been present for 8 months and she has had ED visits for same.It is associated with chest pain ?She has a strong FHx of cardiac disease - her Dad and [place] Dad died of Heart attacks. Dad died at 36. ? ?For the last 3 months she has had L flank pain which radiates anteriorly to her L abdomen and it wakes her up every morning and it improves to a 4/10 later in the day but she wakes up with an 8/10. She has no nausea/ vomiting/ diarrhea/constipation. ?She finds sometimes she has urge incontinence. Denies presence of hematuria, dysuria. ? 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 ? ?History reviewed. No pertinent past medical history. ? ?Past Surgical History:  ?Procedure Laterality Date  ? CHOLECYSTECTOMY    ? ? ?History reviewed. No pertinent family history. ? ?Social History  ? ?Socioeconomic History  ? Marital status: Single  ?  Spouse name: Not on file  ? Number of children: Not on file  ? Years of education: Not on file  ? Highest education level: Not on file  ?Occupational History  ? Not on file  ?Tobacco Use  ? Smoking status: Former  ?  Types: Cigarettes  ? Smokeless tobacco: Never  ?Vaping Use  ? Vaping Use: Never used  ?Substance and Sexual Activity  ? Alcohol use: Yes  ?  Comment: occ  ? Drug use: Not  Currently  ?  Types: Marijuana  ? Sexual activity: Not on file  ?Other Topics Concern  ? Not on file  ?Social History Narrative  ? Not on file  ? ?Social Determinants of Health  ? ?Financial Resource Strain: Not on file  ?Food Insecurity: Not on file  ?Transportation Needs: Not on file  ?Physical Activity: Not on file  ?Stress: Not on file  ?Social Connections: Not on file  ? ? ?Allergies  ?Allergen Reactions  ? Lactose Other (See Comments)  ? ? ?Outpatient Medications Prior to Visit  ?Medication Sig Dispense Refill  ? benzonatate (TESSALON) 100 MG capsule Take 1 capsule (100 mg total) by mouth every 8 (eight) hours. (Patient not taking: Reported on 06/17/2021) 21 capsule 0  ? naproxen (NAPROSYN) 500 MG tablet Take 1 tablet (500 mg total) by mouth 2 (two) times daily with a meal. (Patient not taking: Reported on 06/17/2021) 30 tablet 0  ? ?No facility-administered medications prior to visit.  ? ? ? ?ROS ?Review of Systems  ?Constitutional:  Negative for activity change, appetite change and fatigue.  ?HENT:  Negative for congestion, sinus pressure and sore throat.   ?Eyes:  Negative for visual disturbance.  ?Respiratory:  Negative for cough, chest tightness, shortness of breath and wheezing.   ?Cardiovascular:  Negative for chest pain  and palpitations.  ?Gastrointestinal:  Positive for abdominal pain. Negative for abdominal distention and constipation.  ?Endocrine: Negative for polydipsia.  ?Genitourinary:  Positive for flank pain. Negative for dysuria and frequency.  ?Musculoskeletal:  Negative for arthralgias and back pain.  ?Skin:  Negative for rash.  ?Neurological:  Negative for tremors, light-headedness and numbness.  ?Hematological:  Does not bruise/bleed easily.  ?Psychiatric/Behavioral:  Negative for agitation and behavioral problems.   ? ?Objective:  ?BP 102/68   Pulse 87   Ht 5\' 3"  (1.6 m)   Wt 122 lb (55.3 kg)   SpO2 99%   BMI 21.61 kg/m?  ? ? ?  06/17/2021  ?  2:31 PM 04/24/2021  ?  3:02 PM 04/24/2021  ?   2:49 PM  ?BP/Weight  ?Systolic BP [id number]  ?Diastolic BP 68 79 79  ?Wt. (Lbs) 122    ?BMI 21.61 kg/m2    ? ? ? ? ?Physical Exam ?Constitutional:   ?   Appearance: She is well-developed.  ?Cardiovascular:  ?   Rate and Rhythm: Normal rate.  ?   Heart sounds: Normal heart sounds. No murmur heard. ?Pulmonary:  ?   Effort: Pulmonary effort is normal.  ?   Breath sounds: Normal breath sounds. No wheezing or rales.  ?Chest:  ?   Chest wall: No tenderness.  ?Abdominal:  ?   General: Bowel sounds are normal. There is no distension.  ?   Palpations: Abdomen is soft. There is no mass.  ?   Tenderness: There is abdominal tenderness (LUQ). There is left CVA tenderness.  ?Musculoskeletal:     ?   General: Normal range of motion.  ?   Right lower leg: No edema.  ?   Left lower leg: No edema.  ?Neurological:  ?   Mental Status: She is alert and oriented to person, place, and time.  ?Psychiatric:     ?   Mood and Affect: Mood normal.  ? ? ? ?  Latest Ref Rng & Units 04/24/2021  ? 12:42 PM 04/04/2021  ?  6:29 PM 08/02/2020  ?  3:10 AM  ?CMP  ?Glucose 70 - 99 mg/dL 117   79   90    ?BUN 6 - 20 mg/dL 8   15   12     ?Creatinine 0.44 - 1.00 mg/dL 0.57   0.73   0.73    ?Sodium 135 - 145 mmol/L 139   136   140    ?Potassium 3.5 - 5.1 mmol/L 4.1   4.1   3.3    ?Chloride 98 - 111 mmol/L 103   100   105    ?CO2 22 - 32 mmol/L 27   22   25     ?Calcium 8.9 - 10.3 mg/dL 9.3   9.9   9.4    ?Total Protein 6.0 - 8.5 g/dL  7.7     ?Total Bilirubin 0.0 - 1.2 mg/dL  0.2     ?Alkaline Phos 44 - 121 IU/L  72     ?AST 0 - 40 IU/L  15     ?ALT 0 - 32 IU/L  12     ? ? ?Lipid Panel  ?No results found for: CHOL, TRIG, HDL, CHOLHDL, VLDL, LDLCALC, LDLDIRECT ? ?CBC ?   ?Component Value Date/Time  ? WBC 10.2 04/24/2021 1242  ? RBC 4.09 04/24/2021 1242  ? HGB 11.7 (L) 04/24/2021 1242  ? HGB 14.1 04/04/2021 1829  ? HCT 35.1 (L) 04/24/2021 1242  ?  HCT 42.5 04/04/2021 1829  ? PLT 280 04/24/2021 1242  ? PLT 349 04/04/2021 1829  ? MCV 85.8 04/24/2021 1242  ? MCV 87  04/04/2021 1829  ? MCH 28.6 04/24/2021 1242  ? MCHC 33.3 04/24/2021 1242  ? RDW 13.8 04/24/2021 1242  ? RDW 14.0 04/04/2021 1829  ? LYMPHSABS 2.0 04/04/2021 1829  ? MONOABS 0.9 08/02/2020 0310  ? EOSABS 0.0 04/04/2021 1829  ? BASOSABS 0.0 04/04/2021 1829  ? ? ?No results found for: HGBA1C ? ? ? ?  06/17/2021  ?  2:33 PM  ?Depression screen PHQ 2/9  ?Decreased Interest 0  ?Down, Depressed, Hopeless 0  ?PHQ - 2 Score 0  ?Altered sleeping 0  ?Tired, decreased energy 1  ?Change in appetite 2  ?Feeling bad or failure about yourself  0  ?Trouble concentrating 0  ?Moving slowly or fidgety/restless 0  ?Suicidal thoughts 0  ?PHQ-9 Score 3  ? ? ? ?  06/17/2021  ?  2:33 PM  ?GAD 7 : Generalized Anxiety Score  ?Nervous, Anxious, on Edge 0  ?Control/stop worrying 0  ?Worry too much - different things 0  ?Trouble relaxing 1  ?Restless 0  ?Easily annoyed or irritable 0  ?Afraid - awful might happen 0  ?Total GAD 7 Score 1  ? ? ? ?Assessment & Plan:  ?1. Left flank pain ?We will need to rule out renal calculi ?If CT renal stone is negative consider muscle spasm as etiology ? ?2. Palpitations ?Will evaluate for thyroid disorder ?Given family history of cardiac disease at an early age I will refer to cardiology ?She will probably benefit from an event monitor ?PHQ-9 score is 3 and GAD-7 score is 1 -low suspicion for anxiety but this is not unlikely given her age ?- T4, free ?- TSH ?- Ambulatory referral to Cardiology ? ?3. Left upper quadrant abdominal pain ?See #1 above ?- CT RENAL STONE STUDY; Future ? ?4. Family history of heart attack ?See #2 above ?- Ambulatory referral to Cardiology ? ?Note for work has been provided ? ?No orders of the defined types were placed in this encounter. ? ? ?Follow-up: No follow-ups on file.  ? ? ? ? ? ?Charlott Rakes, MD, FAAFP. ?[address] ?[place], [place] ?[phone]   ?06/17/2021, 5:53 PM ?

## 2021-06-17 NOTE — Patient Instructions (Signed)
Palpitations Palpitations are feelings that your heartbeat is irregular or is faster than normal. It may feel like your heart is fluttering or skipping a beat. Palpitations may be caused by many things, including smoking, caffeine, alcohol, stress, and certain medicines or drugs. Most causes of palpitations are not serious.  However, some palpitations can be a sign of a serious problem. Further tests and a thorough medical history will be done to find the cause of your palpitations. Your provider may order tests such as an ECG, labs, an echocardiogram, or an ambulatory continuous ECG monitor. Follow these instructions at home: Pay attention to any changes in your symptoms. Let your health care provider know about them. Take these actions to help manage your symptoms: Eating and drinking Follow instructions from your health care provider about eating or drinking restrictions. You may need to avoid foods and drinks that may cause palpitations. These may include: Caffeinated coffee, tea, soft drinks, and energy drinks. Chocolate. Alcohol. Diet pills. Lifestyle     Take steps to reduce your stress and anxiety. Things that can help you relax include: Yoga. Mind-body activities, such as deep breathing, meditation, or using words and images to create positive thoughts (guided imagery). Physical activity, such as swimming, jogging, or walking. Tell your health care provider if your palpitations increase with activity. If you have chest pain or shortness of breath with activity, do not continue the activity until you are seen by your health care provider. Biofeedback. This is a method that helps you learn to use your mind to control things in your body, such as your heartbeat. Get plenty of rest and sleep. Keep a regular bed time. Do not use drugs, including cocaine or ecstasy. Do not use marijuana. Do not use any products that contain nicotine or tobacco. These products include cigarettes, chewing  tobacco, and vaping devices, such as e-cigarettes. If you need help quitting, ask your health care provider. General instructions Take over-the-counter and prescription medicines only as told by your health care provider. Keep all follow-up visits. This is important. These may include visits for further testing if palpitations do not go away or get worse. Contact a health care provider if: You continue to have a fast or irregular heartbeat for a long period of time. You notice that your palpitations occur more often. Get help right away if: You have chest pain or shortness of breath. You have a severe headache. You feel dizzy or you faint. These symptoms may represent a serious problem that is an emergency. Do not wait to see if the symptoms will go away. Get medical help right away. Call your local emergency services (911 in the [place] Do not drive yourself to the hospital. Summary Palpitations are feelings that your heartbeat is irregular or is faster than normal. It may feel like your heart is fluttering or skipping a beat. Palpitations may be caused by many things, including smoking, caffeine, alcohol, stress, certain medicines, and drugs. Further tests and a thorough medical history may be done to find the cause of your palpitations. Get help right away if you faint or have chest pain, shortness of breath, severe headache, or dizziness. This information is not intended to replace advice given to you by your health care provider. Make sure you discuss any questions you have with your health care provider. Document Revised: 07/04/2020 Document Reviewed: 07/04/2020 Elsevier Patient Education  2023 Elsevier Inc.  

## 2021-06-17 NOTE — Progress Notes (Signed)
Has left flank pain every morning ?Heart flutters. ?

## 2021-06-18 ENCOUNTER — Other Ambulatory Visit: Payer: Self-pay | Admitting: Family Medicine

## 2021-06-18 DIAGNOSIS — R7989 Other specified abnormal findings of blood chemistry: Secondary | ICD-10-CM

## 2021-06-18 LAB — T4, FREE: Free T4: 1.3 ng/dL (ref 0.82–1.77)

## 2021-06-18 LAB — TSH: TSH: 0.434 u[IU]/mL — ABNORMAL LOW (ref 0.450–4.500)

## 2021-06-21 ENCOUNTER — Inpatient Hospital Stay: Admission: RE | Admit: 2021-06-21 | Payer: Medicaid Other | Source: Ambulatory Visit

## 2021-06-24 DIAGNOSIS — Z419 Encounter for procedure for purposes other than remedying health state, unspecified: Secondary | ICD-10-CM | POA: Diagnosis not present

## 2021-07-25 DIAGNOSIS — Z419 Encounter for procedure for purposes other than remedying health state, unspecified: Secondary | ICD-10-CM | POA: Diagnosis not present

## 2021-08-24 DIAGNOSIS — Z419 Encounter for procedure for purposes other than remedying health state, unspecified: Secondary | ICD-10-CM | POA: Diagnosis not present

## 2021-09-24 DIAGNOSIS — Z419 Encounter for procedure for purposes other than remedying health state, unspecified: Secondary | ICD-10-CM | POA: Diagnosis not present

## 2021-10-25 DIAGNOSIS — Z419 Encounter for procedure for purposes other than remedying health state, unspecified: Secondary | ICD-10-CM | POA: Diagnosis not present

## 2021-11-24 DIAGNOSIS — Z419 Encounter for procedure for purposes other than remedying health state, unspecified: Secondary | ICD-10-CM | POA: Diagnosis not present

## 2021-12-25 DIAGNOSIS — Z419 Encounter for procedure for purposes other than remedying health state, unspecified: Secondary | ICD-10-CM | POA: Diagnosis not present

## 2022-01-24 DIAGNOSIS — Z419 Encounter for procedure for purposes other than remedying health state, unspecified: Secondary | ICD-10-CM | POA: Diagnosis not present
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
## 2022-02-24 DIAGNOSIS — Z419 Encounter for procedure for purposes other than remedying health state, unspecified: Secondary | ICD-10-CM | POA: Diagnosis not present

## 2022-03-27 DIAGNOSIS — Z419 Encounter for procedure for purposes other than remedying health state, unspecified: Secondary | ICD-10-CM | POA: Diagnosis not present

## 2022-04-25 DIAGNOSIS — Z419 Encounter for procedure for purposes other than remedying health state, unspecified: Secondary | ICD-10-CM | POA: Diagnosis not present

## 2022-05-26 DIAGNOSIS — Z419 Encounter for procedure for purposes other than remedying health state, unspecified: Secondary | ICD-10-CM | POA: Diagnosis not present

## 2022-06-25 DIAGNOSIS — Z419 Encounter for procedure for purposes other than remedying health state, unspecified: Secondary | ICD-10-CM | POA: Diagnosis not present

## 2022-06-25 NOTE — Progress Notes (Deleted)
   New Patient Office Visit  Subjective    Patient ID: Shavon Ashmore, female    DOB: [birth year]  Age: 32 y.o. MRN: [id number]  CC: No chief complaint on file.   HPI Vera Furniss presents to establish care. Past medical history includes ***  Outpatient Encounter Medications as of 06/26/2022  Medication Sig   benzonatate (TESSALON) 100 MG capsule Take 1 capsule (100 mg total) by mouth every 8 (eight) hours. (Patient not taking: Reported on 06/17/2021)   naproxen (NAPROSYN) 500 MG tablet Take 1 tablet (500 mg total) by mouth 2 (two) times daily with a meal. (Patient not taking: Reported on 06/17/2021)   No facility-administered encounter medications on file as of 06/26/2022.    No past medical history on file.  Past Surgical History:  Procedure Laterality Date   CHOLECYSTECTOMY      No family history on file.  Social History   Socioeconomic History   Marital status: Single    Spouse name: Not on file   Number of children: Not on file   Years of education: Not on file   Highest education level: Not on file  Occupational History   Not on file  Tobacco Use   Smoking status: Former    Types: Cigarettes   Smokeless tobacco: Never  Vaping Use   Vaping Use: Never used  Substance and Sexual Activity   Alcohol use: Yes    Comment: occ   Drug use: Not Currently    Types: Marijuana   Sexual activity: Not on file  Other Topics Concern   Not on file  Social History Narrative   Not on file   Social Determinants of Health   Financial Resource Strain: Not on file  Food Insecurity: Not on file  Transportation Needs: Not on file  Physical Activity: Not on file  Stress: Not on file  Social Connections: Not on file  Intimate Partner Violence: Not on file    ROS      Objective    There were no vitals taken for this visit.  Physical Exam     Assessment & Plan:  There are no diagnoses linked to this encounter.  No follow-ups on file.   Melida Quitter, PA

## 2022-06-26 ENCOUNTER — Ambulatory Visit: Payer: Medicaid Other | Admitting: Family Medicine

## 2022-07-26 DIAGNOSIS — Z419 Encounter for procedure for purposes other than remedying health state, unspecified: Secondary | ICD-10-CM | POA: Diagnosis not present

## 2022-08-20 ENCOUNTER — Other Ambulatory Visit: Payer: Self-pay

## 2022-08-20 ENCOUNTER — Encounter (HOSPITAL_BASED_OUTPATIENT_CLINIC_OR_DEPARTMENT_OTHER): Payer: Self-pay

## 2022-08-20 ENCOUNTER — Emergency Department (HOSPITAL_BASED_OUTPATIENT_CLINIC_OR_DEPARTMENT_OTHER): Payer: Medicaid Other

## 2022-08-20 ENCOUNTER — Emergency Department (HOSPITAL_BASED_OUTPATIENT_CLINIC_OR_DEPARTMENT_OTHER)
Admission: EM | Admit: 2022-08-20 | Discharge: 2022-08-20 | Disposition: A | Discharge status: Home / Self Care | Payer: Medicaid Other | Attending: Emergency Medicine | Admitting: Emergency Medicine

## 2022-08-20 DIAGNOSIS — R0789 Other chest pain: Secondary | ICD-10-CM | POA: Insufficient documentation

## 2022-08-20 DIAGNOSIS — Z743 Need for continuous supervision: Secondary | ICD-10-CM | POA: Diagnosis not present

## 2022-08-20 DIAGNOSIS — R079 Chest pain, unspecified: Secondary | ICD-10-CM | POA: Diagnosis not present

## 2022-08-20 LAB — BASIC METABOLIC PANEL
Anion gap: 8 (ref 5–15)
BUN: 16 mg/dL (ref 6–20)
CO2: 26 mmol/L (ref 22–32)
Calcium: 9.6 mg/dL (ref 8.9–10.3)
Chloride: 104 mmol/L (ref 98–111)
Creatinine, Ser: 0.65 mg/dL (ref 0.44–1.00)
GFR, Estimated: >60 mL/min (ref >60)
Glucose, Bld: 81 mg/dL (ref 70–99)
Potassium: 4.1 mmol/L (ref 3.5–5.1)
Sodium: 138 mmol/L (ref 135–145)

## 2022-08-20 LAB — CBC
HCT: 35.1 % — ABNORMAL LOW (ref 36.0–46.0)
Hemoglobin: 11.4 g/dL — ABNORMAL LOW (ref 12.0–15.0)
MCH: 27.3 pg (ref 26.0–34.0)
MCHC: 32.5 g/dL (ref 30.0–36.0)
MCV: 84.2 fL (ref 80.0–100.0)
Platelets: 378 10*3/uL (ref 150–400)
RBC: 4.17 MIL/uL (ref 3.87–5.11)
RDW: 13.6 % (ref 11.5–15.5)
WBC: 9.4 10*3/uL (ref 4.0–10.5)
nRBC: 0 % (ref 0.0–0.2)

## 2022-08-20 LAB — D-DIMER, QUANTITATIVE: D-Dimer, Quant: <0.27 ug/mL-FEU (ref 0.00–0.50)

## 2022-08-20 LAB — TROPONIN I (HIGH SENSITIVITY): Troponin I (High Sensitivity): <2 ng/L (ref <18)

## 2022-08-20 NOTE — ED Notes (Signed)
All appropriate discharge materials reviewed at length with patient. Time for questions provided. Pt has no other questions at this time and verbalizes understanding of all provided materials.  

## 2022-08-20 NOTE — ED Provider Notes (Signed)
[place] EMERGENCY DEPARTMENT AT [facility] Provider Note   CSN: [id number] Arrival date & time: 08/20/22  2048     History  Chief Complaint  Patient presents with   Chest Pain    Amy Jefferson is a 32 y.o. female.  32 year old female with no past medical history presents to the ED with a chief complaint of substernal chest pain sharp stabbing nature which began yesterday while she was at work.  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  On today's visit, she reports the pain returned, she is having sharp stabbing pain to the substernal area, reports she saw her dad doubled over due to a heart attack therefore she called EMS who instructed her to take a half of an aspirin, along with some BC powder to help with pain.  She is now rating her pain at 3 out of 10.  There is no radiation of this pain.  No alleviating or exacerbating factors.  She is denying any shortness of breath at this time.  She is currently on no oral contraceptives.  The history is provided by the patient.  Chest Pain Pain location:  Substernal area Pain radiates to:  Does not radiate Pain severity:  Moderate Onset quality:  Gradual Duration:  1 day Timing:  Constant Progression:  Worsening Chronicity:  New Associated symptoms: no abdominal pain, no back pain, no fever, no nausea, no shortness of breath and no vomiting        Home Medications Prior to Admission medications   Not on File      Allergies    Lactose    Review of Systems   Review of Systems  Constitutional:  Negative for chills and fever.  HENT:  Negative for sore throat.   Respiratory:  Negative for shortness of breath.   Cardiovascular:  Positive for chest pain.  Gastrointestinal:  Negative for abdominal pain, nausea and vomiting.  Genitourinary:  Negative for dysuria.  Musculoskeletal:  Negative for back pain.  All other systems reviewed and are  negative.   Physical Exam Updated Vital Signs BP 126/79   Pulse 84   Temp 98 F (36.7 C) (Oral)   Resp 16   Ht 5\' 3"  (1.6 m)   Wt 78.5 kg   LMP 07/29/2022 (Exact Date)   SpO2 100%   BMI 30.65 kg/m  Physical Exam  ED Results / Procedures / Treatments   Labs (all labs ordered are listed, but only abnormal results are displayed) Labs Reviewed  CBC - Abnormal; Notable for the following components:      Result Value   Hemoglobin 11.4 (*)    HCT 35.1 (*)    All other components within normal limits  BASIC METABOLIC PANEL  D-DIMER, QUANTITATIVE  TROPONIN I (HIGH SENSITIVITY)  TROPONIN I (HIGH SENSITIVITY)    EKG EKG Interpretation  Date/Time:  Wednesday August 20 2022 21:28:10 EDT Ventricular Rate:  77 PR Interval:  172 QRS Duration: 92 QT Interval:  383 QTC Calculation: 434 R Axis:   10 Text Interpretation: Sinus rhythm RSR' in V1 or V2, right VCD or RVH Confirmed by Alona Bene [phone]) on 08/20/2022 9:40:17 PM  Radiology DG Chest Port 1 View  Result Date: 08/20/2022 CLINICAL DATA:  cp EXAM: PORTABLE CHEST 1 VIEW.  Patient is rotated COMPARISON:  Chest x-ray 04/24/2021 FINDINGS: The heart and mediastinal contours are within normal limits. No focal consolidation. No pulmonary edema. No pleural  effusion. No pneumothorax. No acute osseous abnormality. IMPRESSION: No active disease. Electronically Signed   By: Tish Frederickson M.D.   On: 08/20/2022 21:32    Procedures Procedures    Medications Ordered in ED Medications - No data to display  ED Course/ Medical Decision Making/ A&P                             Medical Decision Making Amount and/or Complexity of Data Reviewed Labs: ordered. Radiology: ordered.    This patient presents to the ED for concern of chest pain, this involves a number of treatment options, and is a complaint that carries with it a high risk of complications and morbidity.  The differential diagnosis includes ACS, MSK versus PE.   Co  morbidities: Discussed in HPI   Brief History:  See HPI.  EMR reviewed including pt PMHx, past surgical history and past visits to ER.   See HPI for more details   Lab Tests:  I ordered and independently interpreted labs.  The pertinent results include:    I personally reviewed all laboratory work and imaging. Metabolic panel without any acute abnormality specifically kidney function within normal limits and no significant electrolyte abnormalities. CBC without leukocytosis or significant anemia.   Imaging Studies:  NAD. I personally reviewed all imaging studies and no acute abnormality found. I agree with radiology interpretation.  Cardiac Monitoring:  The patient was maintained on a cardiac monitor.  I personally viewed and interpreted the cardiac monitored which showed an underlying rhythm of: NSR  EKG non-ischemic   Medicines ordered:  N/A  Reevaluation:  After the interventions noted above I re-evaluated patient and found that they have :improved   Social Determinants of Health:  The patient's social determinants of health were a factor in the care of this patient  Problem List / ED Course:  Patient presents to the ED with a chief complaint of chest pain that began yesterday while she was at work, sharp severe stabbing pain without any alleviating or exacerbating factors.  Had the same episode today therefore called EMS who recommended aspirin, Excedrin to help with her pain.  Pain is now a 3 out of 10.  No prior cardiac history, does have a father who had a heart attack at the age of 94.  According to patient, similar episode in the past, and was supposed to follow-up with a Holter monitor by primary care, however did not do so.  On evaluation today she is hemodynamically stable, afebrile without any signs of infection such as cough, or fevers, chest x-ray is within normal limits and no signs of pneumonia. Interpretation of her blood work revealed a CBC with no  leukocytosis, hemoglobin is slightly decreased however stable.  BMP with no electrolyte derangement, creatinine levels unremarkable.  Troponin is negative, consider pulmonary embolism dimer is also negative she is not on any OCPs and does not have any other risk factors. We discussed follow-up with cardiology.  Troponin is also negative, pain has subsided, we are not sure of etiology of her pain however will follow-up with her primary care physician for her holter monitor. She is agreeable of plan and treatment.    Dispostion:  After consideration of the diagnostic results and the patients response to treatment, I feel that the patent would benefit from follow up with primary care.     Portions of this note were generated with Scientist, clinical (histocompatibility and immunogenetics). Dictation errors may occur  despite best attempts at proofreading.   Final Clinical Impression(s) / ED Diagnoses Final diagnoses:  Atypical chest pain    Rx / DC Orders ED Discharge Orders     None         Claude Manges, Cordelia Poche 08/20/22 2335    Maia Plan, MD 08/26/22 [phone]

## 2022-08-20 NOTE — Discharge Instructions (Addendum)
Your laboratory results are within normal limits today.  Please follow-up with your primary care physician or to obtain further monitoring for your chest pain.  If experience any worsening symptoms please return to emergency department.

## 2022-08-20 NOTE — ED Triage Notes (Signed)
Arrives EMS from home with substernal chest pains intermittent for 2 days rated as present and 3/10. Says she is unsure if it is related but neck began hurting as well.    911 dispatcher instructed her to take 1/2 of an aspirin dissolved in water prior to medic arrival.

## 2022-08-25 DIAGNOSIS — Z419 Encounter for procedure for purposes other than remedying health state, unspecified: Secondary | ICD-10-CM | POA: Diagnosis not present

## 2022-09-25 DIAGNOSIS — Z419 Encounter for procedure for purposes other than remedying health state, unspecified: Secondary | ICD-10-CM | POA: Diagnosis not present

## 2022-10-26 DIAGNOSIS — Z419 Encounter for procedure for purposes other than remedying health state, unspecified: Secondary | ICD-10-CM | POA: Diagnosis not present

## 2022-11-08 DIAGNOSIS — S39012A Strain of muscle, fascia and tendon of lower back, initial encounter: Secondary | ICD-10-CM | POA: Diagnosis not present

## 2022-11-08 DIAGNOSIS — S233XXA Sprain of ligaments of thoracic spine, initial encounter: Secondary | ICD-10-CM | POA: Diagnosis not present

## 2022-11-10 ENCOUNTER — Ambulatory Visit: Payer: Medicaid Other | Admitting: Family Medicine

## 2022-11-10 ENCOUNTER — Telehealth: Payer: Self-pay | Admitting: Family Medicine

## 2022-11-10 NOTE — Telephone Encounter (Signed)
Schedule apt with [facility]. Patient aware of the appt. And location.

## 2022-11-10 NOTE — Telephone Encounter (Signed)
Pt is calling to request a sooner hospital up appt. Apt scheduled for  12/02/22. Please advise CB- [phone]

## 2022-11-12 DIAGNOSIS — M791 Myalgia, unspecified site: Secondary | ICD-10-CM | POA: Diagnosis not present

## 2022-11-12 DIAGNOSIS — M545 Low back pain, unspecified: Secondary | ICD-10-CM | POA: Diagnosis not present

## 2022-11-25 DIAGNOSIS — Z419 Encounter for procedure for purposes other than remedying health state, unspecified: Secondary | ICD-10-CM | POA: Diagnosis not present

## 2022-11-26 ENCOUNTER — Inpatient Hospital Stay (INDEPENDENT_AMBULATORY_CARE_PROVIDER_SITE_OTHER): Payer: Medicaid Other | Admitting: Primary Care

## 2022-12-02 ENCOUNTER — Inpatient Hospital Stay: Payer: Medicaid Other | Admitting: Family Medicine

## 2022-12-12 DIAGNOSIS — M545 Low back pain, unspecified: Secondary | ICD-10-CM | POA: Diagnosis not present

## 2022-12-19 DIAGNOSIS — M545 Low back pain, unspecified: Secondary | ICD-10-CM | POA: Diagnosis not present

## 2022-12-24 DIAGNOSIS — M545 Low back pain, unspecified: Secondary | ICD-10-CM | POA: Diagnosis not present

## 2022-12-26 DIAGNOSIS — Z419 Encounter for procedure for purposes other than remedying health state, unspecified: Secondary | ICD-10-CM | POA: Diagnosis not present

## 2022-12-31 DIAGNOSIS — M545 Low back pain, unspecified: Secondary | ICD-10-CM | POA: Diagnosis not present

## 2023-01-02 DIAGNOSIS — M545 Low back pain, unspecified: Secondary | ICD-10-CM | POA: Diagnosis not present

## 2023-01-25 DIAGNOSIS — Z419 Encounter for procedure for purposes other than remedying health state, unspecified: Secondary | ICD-10-CM | POA: Diagnosis not present

## 2023-01-28 DIAGNOSIS — M545 Low back pain, unspecified: Secondary | ICD-10-CM | POA: Diagnosis not present

## 2023-02-04 DIAGNOSIS — M545 Low back pain, unspecified: Secondary | ICD-10-CM | POA: Diagnosis not present

## 2023-02-25 DIAGNOSIS — Z419 Encounter for procedure for purposes other than remedying health state, unspecified: Secondary | ICD-10-CM | POA: Diagnosis not present

## 2023-03-28 DIAGNOSIS — Z419 Encounter for procedure for purposes other than remedying health state, unspecified: Secondary | ICD-10-CM | POA: Diagnosis not present

## 2023-04-25 DIAGNOSIS — Z419 Encounter for procedure for purposes other than remedying health state, unspecified: Secondary | ICD-10-CM | POA: Diagnosis not present

## 2023-06-06 DIAGNOSIS — Z419 Encounter for procedure for purposes other than remedying health state, unspecified: Secondary | ICD-10-CM | POA: Diagnosis not present

## 2023-06-12 DIAGNOSIS — Z760 Encounter for issue of repeat prescription: Secondary | ICD-10-CM | POA: Diagnosis not present

## 2023-06-12 DIAGNOSIS — Z3202 Encounter for pregnancy test, result negative: Secondary | ICD-10-CM | POA: Diagnosis not present

## 2023-06-12 DIAGNOSIS — B009 Herpesviral infection, unspecified: Secondary | ICD-10-CM | POA: Diagnosis not present

## 2023-07-02 ENCOUNTER — Ambulatory Visit: Payer: Self-pay

## 2023-07-02 NOTE — Telephone Encounter (Signed)
 Chief Complaint: anxiety Symptoms: panic attack, dreading going to work, anxiety, sleeping issues Frequency: x months Pertinent Negatives: Patient denies self harm/harm to others, substance abuse Disposition: [] ED /[] Urgent Care (no appt availability in office) / [x] Appointment(In office/virtual)/ []  [facility]/ [] Home Care/ [] Refused Recommended Disposition /[] [place] [facility]/ []  Follow-up with PCP Additional Notes: Pt calling c/o anxiety x months. Pt has been utilizing [facility] and other appts to try to manage anxiety, but is needing to establish care with a PCP to better manage per therapist recs. Pt reports taking Prozac x 1 day. Triager reinforced taking med consistently and that it will take a few weeks to build in system to see effects. Pt also endorses being from [place]  with few local friends, but is currently living with kids and partner. Pt does endorse that partner is supportive. Pt states that her anxiety is affecting her day-to-day activities, including work, and she is needing a provider to help her. Triager re-scheduled New Pt appt with alternate provider based on soonest availability. Triager also relayed additional resources to patient including [facility] phone number [phone]) and a Crisis Text Line [phone]), if she is needing immediate assistance prior to appt. Patient verbalized understanding and to call back/utilize behavioral health resources with worsening symptoms.    Copied from CRM [id number]. Topic: Clinical - Red Word Triage >> Jul 02, 2023  4:40 PM [place] B wrote: [facility] that prompted transfer to Nurse Triage: pt has anxiety Reason for Disposition  MODERATE anxiety (e.g., persistent or frequent anxiety symptoms; interferes with sleep, school, or work)  Answer Assessment - Initial Assessment Questions 1. CONCERN: "Did anything happen that prompted you to call today?"      "I;'ve been having these real anxious feelings for a while  now" "I called to day because I need a doctor to fix it" 2. ANXIETY SYMPTOMS: "Can you describe how you (your loved one; patient) have been feeling?" (e.g., tense, restless, panicky, anxious, keyed up, overwhelmed, sense of impending doom).      Hard time sleeping, anxiety about going to work - takes 5 pills of melatonin 5 mg, crying throughout the day Wednesday - panic attack at work, unsure what brought on the emotions, but very anxious, "dreading feeling" 3. ONSET: "How long have you been feeling this way?" (e.g., hours, days, weeks)     X months 4. SEVERITY: "How would you rate the level of anxiety?" (e.g., 0 - 10; or mild, moderate, severe).     severe 5. FUNCTIONAL IMPAIRMENT: "How have these feelings affected your ability to do daily activities?" "Have you had more difficulty than usual doing your normal daily activities?" (e.g., getting better, same, worse; self-care, school, work, interactions)     Affects day-to-day activities 6. HISTORY: "Have you felt this way before?" "Have you ever been diagnosed with an anxiety problem in the past?" (e.g., generalized anxiety disorder, panic attacks, PTSD). If Yes, ask: "How was this problem treated?" (e.g., medicines, counseling, etc.)     Yes, anxiety/panic attacks 7. RISK OF HARM - SUICIDAL IDEATION: "Do you ever have thoughts of hurting or killing yourself?" If Yes, ask:  "Do you have these feelings now?" "Do you have a plan on how you would do this?"     denies 8. TREATMENT:  "What has been done so far to treat this anxiety?" (e.g., medicines, relaxation strategies). "What has helped?"     [facility] suggested PCP to discuss options Prozac - has been taking x 1 day  9. TREATMENT - THERAPIST: "Do you have a counselor or therapist? Name?" Has been talking to therapist at job     Found a therapist via [facility], but stopped d/t financial issues 10. 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 PATIENT SUPPORT: "Who is with you now?" "Who do you live with?" "Do you have family or friends who you can talk to?"        Lives with children and "my guy" Reports gets support from partner, but does endorse being from [place]  so does not have many friends 12. OTHER SYMPTOMS: "Do you have any other symptoms?" (e.g., feeling depressed, trouble concentrating, trouble sleeping, trouble breathing, palpitations or fast heartbeat, chest pain, sweating, nausea, or diarrhea)       Trouble sleeping, feeling of doom 13. PREGNANCY: "Is there any chance you are pregnant?" "When was your last menstrual period?"       denies  Protocols used: Anxiety and Panic Attack-A-AH

## 2023-07-06 ENCOUNTER — Encounter: Admitting: Family Medicine

## 2023-07-06 ENCOUNTER — Ambulatory Visit (INDEPENDENT_AMBULATORY_CARE_PROVIDER_SITE_OTHER): Admitting: Family Medicine

## 2023-07-06 ENCOUNTER — Encounter: Payer: Self-pay | Admitting: Family Medicine

## 2023-07-06 VITALS — BP 101/73 | HR 82 | Temp 98.2°F | Resp 18 | Ht 63.0 in | Wt 192.2 lb

## 2023-07-06 DIAGNOSIS — Z7689 Persons encountering health services in other specified circumstances: Secondary | ICD-10-CM | POA: Insufficient documentation

## 2023-07-06 DIAGNOSIS — R7989 Other specified abnormal findings of blood chemistry: Secondary | ICD-10-CM | POA: Diagnosis not present

## 2023-07-06 DIAGNOSIS — F411 Generalized anxiety disorder: Secondary | ICD-10-CM | POA: Diagnosis not present

## 2023-07-06 DIAGNOSIS — F321 Major depressive disorder, single episode, moderate: Secondary | ICD-10-CM | POA: Diagnosis not present

## 2023-07-06 DIAGNOSIS — Z419 Encounter for procedure for purposes other than remedying health state, unspecified: Secondary | ICD-10-CM | POA: Diagnosis not present

## 2023-07-06 NOTE — Assessment & Plan Note (Signed)
 Flat affect while sitting in exam room.  Denies thoughts of suicide. Prozac 10 mg daily. Aware of [facility] services as she is a resident of [place]. Referral to psychiatry for urgent evaluation. Follow-up in one week.

## 2023-07-06 NOTE — Progress Notes (Signed)
 New Patient Office Visit  Subjective    Patient ID: Amy Jefferson, female    DOB: 03-04-1990  Age: 33 y.o. MRN: [id number]  CC:  Chief Complaint  Patient presents with   New Patient (Initial Visit)    Est. Care    HPI Amy Jefferson presents to establish care with this practice. She is new to me.  Depression and anxiety: Present since January. Seeing a therapist for anxiety. Meeting with therapist weekly for anxiety. 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  Prescribed Prozac 10 mg last Tuesday.  Therapeutic affect has not been obtained.  Describes panic attacks. Taking melatonin to sleep and dreads to go to work.  Reports vomiting at work.  Stopped going to work last Thursday. Has not quit work, unsure of plans. She may need FMLA paperwork completed. Will bring to next visit if needed.  GAD-7: 20 PHQ-9: 13  [facility]: telehealth service On Monjaro 5 mg weekly for weight loss.      Outpatient Encounter Medications as of 07/06/2023  Medication Sig   FLUoxetine (PROZAC) 10 MG capsule Take 10 mg by mouth daily.   tirzepatide (MOUNJARO) 5 MG/0.5ML Pen Inject 5 mg into the skin once a week.   valACYclovir (VALTREX) 1000 MG tablet Take by mouth.   No facility-administered encounter medications on file as of 07/06/2023.    Past Medical History:  Diagnosis Date   Anxiety    Depression     Past Surgical History:  Procedure Laterality Date   CHOLECYSTECTOMY      History reviewed. No pertinent family history.  Social History   Socioeconomic History   Marital status: Single    Spouse name: Not on file   Number of children: Not on file   Years of education: Not on file   Highest education level: Not on file  Occupational History   Not on file  Tobacco Use   Smoking status: Former    Types: Cigarettes   Smokeless tobacco: Never  Vaping Use   Vaping status: Never Used  Substance and Sexual Activity   Alcohol use: Yes     Comment: occ   Drug use: Not Currently    Types: Marijuana   Sexual activity: Not on file  Other Topics Concern   Not on file  Social History Narrative   Not on file   Social Drivers of Health   Financial Resource Strain: High Risk (11/08/2019)   Received from [facility], [facility]   Overall Financial Resource Strain (CARDIA)    Difficulty of Paying Living Expenses: Hard  Food Insecurity: Not on file  Transportation Needs: Not on file  Physical Activity: Not on file  Stress: No Stress Concern Present (11/08/2019)   Received from [facility], [facility]   [employer] [facility] Occupational [facility] - Occupational Stress Questionnaire    Feeling of Stress : Not at all  Social Connections: Unknown (07/08/2021)   Received from [facility], [facility]   Social Network    Social Network: Not on file  Intimate Partner Violence: Unknown (05/30/2021)   Received from [facility], [facility]   HITS    Physically Hurt: Not on file    Insult or Talk Down To: Not on file    Threaten Physical Harm: Not on file    Scream or Curse: Not on file    Review of Systems  Psychiatric/Behavioral:  Positive for depression. Negative for suicidal ideas. The patient is  nervous/anxious.         Objective    BP 101/73 (BP Location: Left Arm, Patient Position: Sitting, Cuff Size: Large)   Pulse 82   Temp 98.2 F (36.8 C) (Oral)   Resp 18   Ht 5\' 3"  (1.6 m)   Wt 192 lb 3 oz (87.2 kg)   SpO2 100%   BMI 34.04 kg/m   Physical Exam Cardiovascular:     Rate and Rhythm: Regular rhythm.  Pulmonary:     Effort: Pulmonary effort is normal.     Breath sounds: Normal breath sounds.  Skin:    General: Skin is warm and dry.  Neurological:     General: No focal deficit present.     Mental Status: Mental status is at baseline.  Psychiatric:        Behavior: Behavior normal.        Thought Content: Thought content normal.        Judgment: Judgment normal.    Flowsheet Row Office  Visit from 07/06/2023 in [facility] Total Score 13     Denies thoughts of self-harm.  Aware of [facility] services.     07/06/2023    2:09 PM 06/17/2021    2:33 PM  GAD 7 : Generalized Anxiety Score  Nervous, Anxious, on Edge 3 0  Control/stop worrying 3 0  Worry too much - different things 3 0  Trouble relaxing 3 1  Restless 3 0  Easily annoyed or irritable 3 0  Afraid - awful might happen 2 0  Total GAD 7 Score 20 1  Anxiety Difficulty Extremely difficult         Assessment & Plan:   Problem List Items Addressed This Visit     GAD (generalized anxiety disorder)   Severe anxiety exacerbated by stress at job. Started on Prozac 10 mg one week ago. Seeing therapist weekly. History of low TSH. Will get full thyroid panel today.  GAD-7 score: 20 today. Referral to psychiatry for evaluation. Follow-up in one week, will likely need increase dose of Prozac.        Relevant Medications   FLUoxetine (PROZAC) 10 MG capsule   Other Relevant Orders   TSH+T4F+T3Free   Ambulatory referral to Psychiatry   Low TSH level   Relevant Orders   TSH+T4F+T3Free   Establishing care with new doctor, encounter for - Primary   Depression, major, single episode, moderate (HCC)   Flat affect while sitting in exam room.  Denies thoughts of suicide. Prozac 10 mg daily. Aware of [facility] services as she is a resident of [place]. Referral to psychiatry for urgent evaluation. Follow-up in one week.       Relevant Medications   FLUoxetine (PROZAC) 10 MG capsule  Agrees with plan of care discussed.  Questions answered.   Return in about 1 week (around 07/13/2023) for GAD and depression.   Mickiel Albany, FNP

## 2023-07-06 NOTE — Assessment & Plan Note (Addendum)
 Severe anxiety exacerbated by stress at job. Started on Prozac 10 mg one week ago. Seeing therapist weekly. History of low TSH. Will get full thyroid panel today.  GAD-7 score: 20 today. Referral to psychiatry for evaluation. Follow-up in one week, will likely need increase dose of Prozac.

## 2023-07-07 ENCOUNTER — Ambulatory Visit: Payer: Self-pay | Admitting: Family Medicine

## 2023-07-07 ENCOUNTER — Telehealth: Payer: Self-pay

## 2023-07-07 LAB — TSH+T4F+T3FREE
Free T4: 1.45 ng/dL (ref 0.82–1.77)
T3, Free: 3.1 pg/mL (ref 2.0–4.4)
TSH: 0.562 u[IU]/mL (ref 0.450–4.500)

## 2023-07-07 NOTE — Telephone Encounter (Signed)
 Thank you,I will find another provider.

## 2023-07-07 NOTE — Telephone Encounter (Signed)
 Copied from CRM [id number]. Topic: Referral - Status >> Jul 07, 2023 11:30 AM Carlatta H wrote: Reason for CRM: [facility] does not accept the patient insurance

## 2023-07-13 ENCOUNTER — Ambulatory Visit: Admitting: Family Medicine

## 2023-07-13 ENCOUNTER — Encounter: Payer: Self-pay | Admitting: Family Medicine

## 2023-07-13 VITALS — BP 125/80 | HR 104 | Temp 98.8°F | Resp 18 | Ht 63.0 in | Wt 190.6 lb

## 2023-07-13 DIAGNOSIS — F411 Generalized anxiety disorder: Secondary | ICD-10-CM

## 2023-07-13 DIAGNOSIS — F321 Major depressive disorder, single episode, moderate: Secondary | ICD-10-CM | POA: Diagnosis not present

## 2023-07-13 DIAGNOSIS — H66001 Acute suppurative otitis media without spontaneous rupture of ear drum, right ear: Secondary | ICD-10-CM | POA: Insufficient documentation

## 2023-07-13 MED ORDER — CIPROFLOXACIN-DEXAMETHASONE 0.3-0.1 % OT SUSP
4.0000 [drp] | Freq: Two times a day (BID) | OTIC | 0 refills | Status: AC
Start: 2023-07-13 — End: ?

## 2023-07-13 MED ORDER — FLUOXETINE HCL 20 MG PO TABS
20.0000 mg | ORAL_TABLET | Freq: Every day | ORAL | 0 refills | Status: AC
Start: 2023-07-13 — End: ?

## 2023-07-13 NOTE — Progress Notes (Signed)
 Established Patient Office Visit  Subjective   Patient ID: Amy Jefferson, female    DOB: 08-04-1990  Age: 33 y.o. MRN: [id number]  Chief Complaint  Patient presents with   Medical Management of Chronic Issues    1 wk GAD f/u and discuss labs pt states anxiety from job as not gotten any better    HPI  Depression and anxiety:  Taking Prozac  10 mg daily.  Symptoms have not improved yet. No thoughts of self harm.  Will increase dose today to 20 mg.  Presents to complete [employer] paperwork due to ongoing anxiety.  Working on getting in with psychiatry. Needs to return call and schedule appointment.   Right ear pain: Symptoms present for a few days. Pain on inside and outside.      Review of Systems  HENT:  Positive for ear pain.   Psychiatric/Behavioral:  Positive for depression. Negative for suicidal ideas. The patient is nervous/anxious.       Objective:     BP 125/80 (BP Location: Left Wrist, Patient Position: Sitting, Cuff Size: Large)   Pulse (!) 104   Temp 98.8 F (37.1 C) (Oral)   Resp 18   Ht 5\' 3"  (1.6 m)   Wt 190 lb 9 oz (86.4 kg)   SpO2 100%   BMI 33.76 kg/m  BP Readings from Last 3 Encounters:  07/13/23 125/80  07/06/23 101/73  08/20/22 126/79      Physical Exam Vitals and nursing note reviewed.  HENT:     Right Ear: Tympanic membrane is erythematous.     Left Ear: Tympanic membrane normal.  Pulmonary:     Effort: Pulmonary effort is normal.  Skin:    General: Skin is warm and dry.  Neurological:     General: No focal deficit present.     Mental Status: Mental status is at baseline.  Psychiatric:        Mood and Affect: Mood normal.        Behavior: Behavior normal.        Thought Content: Thought content normal.        Judgment: Judgment normal.      No results found for any visits on 07/13/23.    The ASCVD Risk score (Arnett DK, et al., 2019) failed to calculate for the following reasons:   The 2019 ASCVD risk score is only  valid for ages 21 to 30    Assessment & Plan:   Problem List Items Addressed This Visit     GAD (generalized anxiety disorder) - Primary   Relevant Medications   FLUoxetine  (PROZAC ) 20 MG tablet   Depression, major, single episode, moderate (HCC)   Prozac  increased to 20 mg today. Symptoms have not improved on 10 mg dose.  No thoughts of self harm. Needs to call to schedule appointment with psychiatry. Has been out of work for depression and anxiety. Paperwork completed today. May need psychiatry to complete as well. Follow-up in 4 weeks for medication management.        Relevant Medications   FLUoxetine  (PROZAC ) 20 MG tablet   Non-recurrent acute suppurative otitis media of right ear without spontaneous rupture of tympanic membrane   Right ear pain with erythematous TM. Cipro  dex drops to right ear BID as instructed.  Follow-up if symptoms do not resolve.       Relevant Medications   ciprofloxacin -dexamethasone  (CIPRODEX ) OTIC suspension  Agrees with plan of care discussed.  Questions answered.   Return in about 4 weeks (  around 08/10/2023) for depression and anxiety med management.    Mickiel Albany, FNP

## 2023-07-13 NOTE — Assessment & Plan Note (Signed)
 Right ear pain with erythematous TM. Cipro  dex drops to right ear BID as instructed.  Follow-up if symptoms do not resolve.

## 2023-07-13 NOTE — Assessment & Plan Note (Addendum)
 Prozac  increased to 20 mg today. Symptoms have not improved on 10 mg dose.  No thoughts of self harm. Needs to call to schedule appointment with psychiatry. Has been out of work for depression and anxiety. Paperwork completed today. May need psychiatry to complete as well. Follow-up in 4 weeks for medication management.

## 2023-07-15 ENCOUNTER — Telehealth: Payer: Self-pay | Admitting: Family Medicine

## 2023-07-15 NOTE — Telephone Encounter (Signed)
 Copied from CRM [id number]. Topic: Medical Record Request - Payor/Billing Request >> Jul 14, 2023  4:54 PM [place] J wrote: Reason for CRM: Jasmine calling from [employer] in regards to a form called "Mental Health Attending Physicians Statement" that is 4 pages.  Form was sent back but the 4th page is blank. They need the full form sent back with the 4th page including the provider's name and signature.  Page # 4 completed and will be re-faxed. Buddie Carina, NP

## 2023-08-06 DIAGNOSIS — Z419 Encounter for procedure for purposes other than remedying health state, unspecified: Secondary | ICD-10-CM | POA: Diagnosis not present

## 2023-08-10 ENCOUNTER — Ambulatory Visit: Admitting: Family Medicine

## 2023-08-19 ENCOUNTER — Encounter: Admitting: Family Medicine

## 2023-09-05 DIAGNOSIS — Z419 Encounter for procedure for purposes other than remedying health state, unspecified: Secondary | ICD-10-CM | POA: Diagnosis not present

## 2023-10-06 DIAGNOSIS — Z419 Encounter for procedure for purposes other than remedying health state, unspecified: Secondary | ICD-10-CM | POA: Diagnosis not present

## 2023-11-06 DIAGNOSIS — Z419 Encounter for procedure for purposes other than remedying health state, unspecified: Secondary | ICD-10-CM | POA: Diagnosis not present

## 2023-12-06 DIAGNOSIS — Z419 Encounter for procedure for purposes other than remedying health state, unspecified: Secondary | ICD-10-CM | POA: Diagnosis not present

## 2024-03-11 ENCOUNTER — Ambulatory Visit: Payer: Self-pay | Admitting: Medical

## 2024-03-11 ENCOUNTER — Ambulatory Visit (HOSPITAL_BASED_OUTPATIENT_CLINIC_OR_DEPARTMENT_OTHER)
Admission: RE | Admit: 2024-03-11 | Discharge: 2024-03-11 | Disposition: A | Discharge status: Home / Self Care | Source: Ambulatory Visit | Attending: Medical | Admitting: Medical

## 2024-03-11 ENCOUNTER — Ambulatory Visit (INDEPENDENT_AMBULATORY_CARE_PROVIDER_SITE_OTHER): Admitting: Medical

## 2024-03-11 VITALS — BP 120/80 | HR 71 | Temp 98.2°F | Resp 15 | Ht 63.0 in | Wt 194.6 lb

## 2024-03-11 DIAGNOSIS — R0789 Other chest pain: Secondary | ICD-10-CM | POA: Diagnosis not present

## 2024-03-11 DIAGNOSIS — R0781 Pleurodynia: Secondary | ICD-10-CM | POA: Insufficient documentation

## 2024-03-11 LAB — TROPONIN I: Troponin I: <3 ng/L

## 2024-03-11 NOTE — Progress Notes (Signed)
 "  Subjective:    Patient ID: Amy Jefferson, female    DOB: 1990/04/16, 34 y.o.   MRN: [id number]  HPI  Amy Jefferson is a 34 year old female who presents with recurrent chest pain.  She has sharp, pleuritic pain under the breast in the rib area that occurs around 4-5 AM while she is awake in bed. Pain is sudden, lasted this morning up to five minutes, and has recurred intermittently for years. It is worse with breathing, coughing, sneezing, and movement. She was evaluated in an emergency department years ago and told it might be pleuritic pain or costochondritis, without subsequent follow-up. She is currently pain free, without shortness of breath at rest or with walking, wheezing, jaw pain, or left shoulder pain. She has not taken specific medications for this pain or had outpatient evaluation for it. No leg pain. No leg swelling.    Review of Systems  Constitutional:  Negative for chills, fatigue and fever.  HENT:  Negative for congestion.   Respiratory:  Negative for cough, chest tightness, shortness of breath and wheezing.   Cardiovascular:  Negative for chest pain and palpitations.       See hpi  Gastrointestinal:  Negative for abdominal pain.  Musculoskeletal:  Negative for back pain.       See hpi and exam  Neurological:  Negative for dizziness, syncope, weakness and light-headedness.  Hematological:  Negative for adenopathy.  Psychiatric/Behavioral:  Negative for behavioral problems and decreased concentration.     Past Medical History:  Diagnosis Date   Anxiety    Depression      Social History   Socioeconomic History   Marital status: Single    Spouse name: Not on file   Number of children: Not on file   Years of education: Not on file   Highest education level: Not on file  Occupational History   Not on file  Tobacco Use   Smoking status: Former    Types: Cigarettes   Smokeless tobacco: Never  Vaping Use   Vaping status: Never Used  Substance and Sexual  Activity   Alcohol use: Yes    Comment: occ   Drug use: Not Currently    Types: Marijuana   Sexual activity: Not on file  Other Topics Concern   Not on file  Social History Narrative   Not on file   Social Drivers of Health   Tobacco Use: Medium Risk (07/13/2023)   Patient History    Smoking Tobacco Use: Former    Smokeless Tobacco Use: Never    Passive Exposure: Not on Actuary Strain: Not on file  Food Insecurity: Not on file  Transportation Needs: Not on file  Physical Activity: Not on file  Stress: Not on file  Social Connections: Unknown (07/08/2021)   [facility]   Social Network    Social Network: Not on file  Intimate Partner Violence: Unknown (05/30/2021)   Received from [facility]   HITS    Physically Hurt: Not on file    Insult or Talk Down To: Not on file    Threaten Physical Harm: Not on file    Scream or Curse: Not on file  Depression (PHQ2-9): Low Risk (03/11/2024)   Depression (PHQ2-9)    PHQ-2 Score: 1  Alcohol Screen: Not on file  Housing: Not on file  Utilities: Not on file  Health Literacy: Not on file    Past Surgical History:  Procedure Laterality Date  CHOLECYSTECTOMY      No family history on file.  Allergies[1]  Medications Ordered Prior to Encounter[2]  BP 120/80   Pulse 71   Temp 98.2 F (36.8 C) (Oral)   Resp 15   Ht 5' 3 (1.6 m)   Wt 194 lb 9.6 oz (88.3 kg)   LMP 03/09/2024 (Exact Date)   SpO2 99%   BMI 34.47 kg/m        Objective:   Physical Exam  General- No acute distress. Pleasant patient. Neck- Full range of motion, no jvd Lungs- Clear, even and unlabored. Heart- regular rate and rhythm. Neurologic- CNII- XII grossly intact.  Anterior chest- lower rib beneath breast. Slight costochondral junction tenderness to palpation. Lower ext- calfs symmetric, negative homans signs. No pedal edema.      Assessment & Plan:   . Patient Instructions  Pleuritic chest pain vs  costochondritis Intermittent pleuritic chest pain with increased frequency, previously evaluated as pleuritic pain or costochondritis. -Get EKG to evaluate differential of atypical chest pain though patient's description of pain is more To pleuritic versus costochondritis.  Patient EKG showed atrial rhythm with rate 74.  QRS was 1-1 and no significant T wave abnormalities.  Patient does not report any palpitations or tachycardia.  Advising patient to follow-up with her PCP.  If any chest pain palpitations or tachycardia occurs prior to follow-up with PCP to be seen in the emergency department. -discusse EKG with Dr. Charise cardiologist he did not think 1st EKG was worrisome. He recommended repeat EKG after brief pt wallk. Med assistant made sure lead 2 good connection. On recheck normal sinus rhythm. -Will get chest x-ray today for pleuritic pain. -Can take low-dose ibuprofen 200 mg every 8 hours for the next 2 to 3 days in the event of pleuritic or costochondritis type pain.   Follow-up with PCP next week or sooner if needed.   Amy Costlow, PA-C     [1]  Allergies Allergen Reactions   Lactose Other (See Comments)  [2]  Current Outpatient Medications on File Prior to Visit  Medication Sig Dispense Refill   ciprofloxacin -dexamethasone  (CIPRODEX ) OTIC suspension Place 4 drops into the right ear 2 (two) times daily. 7.5 mL 0   FLUoxetine  (PROZAC ) 20 MG tablet Take 1 tablet (20 mg total) by mouth daily. 30 tablet 0   tirzepatide (MOUNJARO) 5 MG/0.5ML Pen Inject 5 mg into the skin once a week.     valACYclovir (VALTREX) 1000 MG tablet Take by mouth.     No current facility-administered medications on file prior to visit.   "

## 2024-03-11 NOTE — Patient Instructions (Addendum)
 Pleuritic chest pain vs costochondritis Intermittent pleuritic chest pain with increased frequency, previously evaluated as pleuritic pain or costochondritis. -Get EKG to evaluate differential of atypical chest pain though patient's description of pain is more To pleuritic versus costochondritis.  Patient 1sr EKG showed atrial rhythm with rate 74.  QRS was 1-1 and no significant T wave abnormalities.  Patient does not report any palpitations or tachycardia.  Advising patient to follow-up with her PCP.  If any chest pain palpitations or tachycardia occurs prior to follow-up with PCP to be seen in the emergency department. -discussed EKG with Dr. Charise cardiologist he did not think 1st EKG was worrisome. He recommended repeat EKG after brief pt wallk. Med assistant made sure lead 2 good connection. On recheck normal sinus rhythm. -Will get chest x-ray today for pleuritic pain. -Can take low-dose ibuprofen 200 mg every 8 hours for the next 2 to 3 days in the event of pleuritic or costochondritis type pain.   Follow-up with PCP next week or sooner if needed.
